# Patient Record
Sex: Male | Born: 1985 | Race: White | Hispanic: No | Smoking: Current every day smoker
Health system: Southern US, Community
[De-identification: ages and names within clinical notes are randomized; demographics above are authoritative.]

## PROBLEM LIST (undated history)

## (undated) DIAGNOSIS — K219 Gastro-esophageal reflux disease without esophagitis: Secondary | ICD-10-CM

## (undated) HISTORY — DX: Gastro-esophageal reflux disease without esophagitis: K21.9

## (undated) HISTORY — PX: WISDOM TOOTH EXTRACTION: SHX21

## (undated) HISTORY — PX: TONSILLECTOMY: SUR1361

---

## 2016-09-19 ENCOUNTER — Ambulatory Visit (INDEPENDENT_AMBULATORY_CARE_PROVIDER_SITE_OTHER): Payer: BLUE CROSS/BLUE SHIELD | Admitting: Primary Care

## 2016-09-19 ENCOUNTER — Encounter: Payer: Self-pay | Admitting: Primary Care

## 2016-09-19 VITALS — BP 120/86 | HR 70 | Temp 98.2°F | Ht 70.25 in | Wt 224.0 lb

## 2016-09-19 DIAGNOSIS — R1030 Lower abdominal pain, unspecified: Secondary | ICD-10-CM | POA: Diagnosis not present

## 2016-09-19 DIAGNOSIS — K219 Gastro-esophageal reflux disease without esophagitis: Secondary | ICD-10-CM | POA: Diagnosis not present

## 2016-09-19 LAB — COMPREHENSIVE METABOLIC PANEL
ALT: 90 U/L — AB (ref 0–53)
AST: 65 U/L — AB (ref 0–37)
Albumin: 4.3 g/dL (ref 3.5–5.2)
Alkaline Phosphatase: 55 U/L (ref 39–117)
BILIRUBIN TOTAL: 0.5 mg/dL (ref 0.2–1.2)
BUN: 6 mg/dL (ref 6–23)
CHLORIDE: 107 meq/L (ref 96–112)
CO2: 23 meq/L (ref 19–32)
CREATININE: 1.03 mg/dL (ref 0.40–1.50)
Calcium: 9.9 mg/dL (ref 8.4–10.5)
GFR: 90.55 mL/min (ref >60.00)
GLUCOSE: 127 mg/dL — AB (ref 70–99)
Potassium: 3.8 mEq/L (ref 3.5–5.1)
SODIUM: 138 meq/L (ref 135–145)
Total Protein: 6.9 g/dL (ref 6.0–8.3)

## 2016-09-19 LAB — CBC WITH DIFFERENTIAL/PLATELET
BASOS ABS: 0.1 10*3/uL (ref 0.0–0.1)
BASOS PCT: 0.9 % (ref 0.0–3.0)
EOS ABS: 0.2 10*3/uL (ref 0.0–0.7)
Eosinophils Relative: 1.8 % (ref 0.0–5.0)
HEMATOCRIT: 51.5 % (ref 39.0–52.0)
Hemoglobin: 17.6 g/dL — ABNORMAL HIGH (ref 13.0–17.0)
LYMPHS ABS: 2.9 10*3/uL (ref 0.7–4.0)
LYMPHS PCT: 33.1 % (ref 12.0–46.0)
MCHC: 34.2 g/dL (ref 30.0–36.0)
MCV: 94.9 fl (ref 78.0–100.0)
Monocytes Absolute: 0.5 10*3/uL (ref 0.1–1.0)
Monocytes Relative: 6 % (ref 3.0–12.0)
NEUTROS ABS: 5.1 10*3/uL (ref 1.4–7.7)
NEUTROS PCT: 58.2 % (ref 43.0–77.0)
PLATELETS: 280 10*3/uL (ref 150.0–400.0)
RBC: 5.43 Mil/uL (ref 4.22–5.81)
RDW: 14.2 % (ref 11.5–15.5)
WBC: 8.7 10*3/uL (ref 4.0–10.5)

## 2016-09-19 MED ORDER — OMEPRAZOLE 40 MG PO CPDR: 40.0000 mg | DELAYED_RELEASE_CAPSULE | Freq: Every day | ORAL | 0 refills | Status: DC

## 2016-09-19 NOTE — Progress Notes (Signed)
Pre visit review using our clinic review tool, if applicable. No additional management support is needed unless otherwise documented below in the visit note. 

## 2016-09-19 NOTE — Progress Notes (Signed)
Subjective:    Patient ID: Dylan Paul, male    DOB: 1986/04/12, 31 y.o.   MRN: [id number]  HPI  Dylan Paul is a 31 year old male who presents today to establish care and discuss the problems mentioned below. Will obtain old records.  1) Abdominal Pain: Located to the mid lower abdomen that has been present for the past 2-3 weeks. He describes his pain as a dull throbbing sensation that is intermittent. He denies fevers. He does experience diarrhea every 2-3 days and will get 4 episodes of diarrhea each day. He denies unexplained weight loss, bloody stools.   2) GERD: Long history of heartburn since childhood. His symptoms consist of esophageal burning, chest pressure, waking up with nausea and then vomiting. The vomiting is the newest symptom that has occurred three times over the past 2 months. He takes Zantac 150 mg once nightly 4-5 times weekly when he remembers and also takes about 10 Tums daily throughout the day. He denies bloody vomitus.  Diet currently consists of:  Breakfast: Skips Lunch: Sandwich, fast food Dinner: Grilled chicken, pasta, potatoes Snacks: Occasionally chips Desserts: Occasionally Beverages: Mostly soda and sweet tea, sometimes water. No coffee. Drinks 3-12 beers daily, six days weekly.    Review of Systems  Constitutional: Negative for fatigue and fever.  Respiratory: Negative for shortness of breath.   Cardiovascular: Negative for chest pain.  Gastrointestinal: Positive for abdominal pain, diarrhea, nausea and vomiting. Negative for blood in stool and constipation.  Neurological: Negative for weakness.     Past Medical History:  Diagnosis Date  . GERD (gastroesophageal reflux disease)      Social History   Social History  . Marital status: Married    Spouse name: N/A  . Number of children: N/A  . Years of education: N/A   Occupational History  . Not on file.   Social History Main Topics  . Smoking status: Current Every Day  Smoker    Packs/day: 1.00    Types: Cigarettes  . Smokeless tobacco: Never Used  . Alcohol use Yes  . Drug use: No  . Sexual activity: Not on file   Other Topics Concern  . Not on file   Social History Narrative   Married.   Wife pregnant.   Works at [employer].   Enjoys playing video games.     No past surgical history on file.  Family History  Problem Relation Age of Onset  . Hypertension Mother   . Lupus Father   . Heart disease Maternal Grandmother   . Skin cancer Paternal Grandmother     No Known Allergies  No current outpatient prescriptions on file prior to visit.   No current facility-administered medications on file prior to visit.     BP 120/86   Pulse 70   Temp 98.2 F (36.8 C) (Oral)   Ht 5' 10.25" (1.784 m)   Wt 224 lb (101.6 kg)   SpO2 97%   BMI 31.91 kg/m    Objective:   Physical Exam  Constitutional: He is oriented to person, place, and time. He appears well-nourished. He does not appear ill.  Neck: Neck supple.  Cardiovascular: Normal rate and regular rhythm.   Pulmonary/Chest: Effort normal and breath sounds normal. He has no wheezes. He has no rales.  Abdominal: Soft. Bowel sounds are normal. There is tenderness in the left lower quadrant.  Neurological: He is alert and oriented to person, place, and time.  Skin: Skin is  warm and dry.  Psychiatric: He has a normal mood and affect.          Assessment & Plan:

## 2016-09-19 NOTE — Patient Instructions (Signed)
Complete lab work prior to leaving today. I will notify you of your results once received.   Start omeprazole 40 mg tablets for acid reflux. Take 1 tablet by mouth once daily for 30 days. We will then reduce your dose thereafter.  I will call you in 1 week for an update in your acid reflux.   Stop smoking, stop eating fast/fried foods, reduce alcohol as all of these things can trigger reflux.. Take a look at the information below.  It was a pleasure to meet you today! Please don't hesitate to call me with any questions. Welcome to [facility]!    Food Choices for Gastroesophageal Reflux Disease, Adult When you have gastroesophageal reflux disease (GERD), the foods you eat and your eating habits are very important. Choosing the right foods can help ease your discomfort. What guidelines do I need to follow?  Choose fruits, vegetables, whole grains, and low-fat dairy products.  Choose low-fat meat, fish, and poultry.  Limit fats such as oils, salad dressings, butter, nuts, and avocado.  Keep a food diary. This helps you identify foods that cause symptoms.  Avoid foods that cause symptoms. These may be different for everyone.  Eat small meals often instead of 3 large meals a day.  Eat your meals slowly, in a place where you are relaxed.  Limit fried foods.  Cook foods using methods other than frying.  Avoid drinking alcohol.  Avoid drinking large amounts of liquids with your meals.  Avoid bending over or lying down until 2-3 hours after eating. What foods are not recommended? These are some foods and drinks that may make your symptoms worse: Vegetables  Tomatoes. Tomato juice. Tomato and spaghetti sauce. Chili peppers. Onion and garlic. Horseradish. Fruits  Oranges, grapefruit, and lemon (fruit and juice). Meats  High-fat meats, fish, and poultry. This includes hot dogs, ribs, ham, sausage, salami, and bacon. Dairy  Whole milk and chocolate milk. Sour cream. Cream. Butter.  Ice cream. Cream cheese. Drinks  Coffee and tea. Bubbly (carbonated) drinks or energy drinks. Condiments  Hot sauce. Barbecue sauce. Sweets/Desserts  Chocolate and cocoa. Donuts. Peppermint and spearmint. Fats and Oils  High-fat foods. This includes [place] fries and potato chips. Other  Vinegar. Strong spices. This includes black pepper, white pepper, red pepper, cayenne, curry powder, cloves, ginger, and chili powder. The items listed above may not be a complete list of foods and drinks to avoid. Contact your dietitian for more information.  This information is not intended to replace advice given to you by your health care provider. Make sure you discuss any questions you have with your health care provider. Document Released: 11/13/2011 Document Revised: 10/20/2015 Document Reviewed: 03/18/2013 Elsevier Interactive Patient Education  2017 [employer].

## 2016-09-19 NOTE — Assessment & Plan Note (Signed)
Long history of, not well controlled based off of symptoms and Tums consumption. Given intensity of symptoms will treat with omeprazole 40 mg once daily for 30 days. Will check on his symptoms in 1 week. Long discussion today regarding trigger foods as his diet is full of triggers of GERD. If no improvement after 1-2 weeks, add in HS Zantac. Consider GI referral in 1 month if symptoms persist.

## 2016-09-19 NOTE — Assessment & Plan Note (Signed)
Symptoms sound like IBS, especially given diarrhea which is chronic for him. Exam today with mild tenderness to LLQ. Check CBC and CMP today. Continue to monitor.

## 2016-09-26 ENCOUNTER — Telehealth: Payer: Self-pay | Admitting: Primary Care

## 2016-09-26 NOTE — Telephone Encounter (Signed)
-----   Message from Doreene Nest, NP sent at 09/19/2016  2:53 PM EDT ----- Regarding: GERD How's he doing on the omeprazole 40 mg for GERD?

## 2016-09-27 NOTE — Telephone Encounter (Signed)
Per DPR, left detail message of Kate's comments for patient to call back. 

## 2016-09-27 NOTE — Telephone Encounter (Signed)
Noted and glad to hear! 

## 2016-09-27 NOTE — Telephone Encounter (Signed)
Patient returned Chan's call. Patient said the Omeprazole is doing great.  Patient said he's had almost no heartburn.

## 2016-10-18 ENCOUNTER — Telehealth: Payer: Self-pay | Admitting: Primary Care

## 2016-10-18 DIAGNOSIS — K219 Gastro-esophageal reflux disease without esophagitis: Secondary | ICD-10-CM

## 2016-10-18 MED ORDER — OMEPRAZOLE 20 MG PO CPDR: 20.0000 mg | DELAYED_RELEASE_CAPSULE | Freq: Every day | ORAL | 0 refills | Status: DC

## 2016-10-18 NOTE — Telephone Encounter (Signed)
Ok to refill? Electronically refill request for omeprazole (PRILOSEC) 40 MG capsule. Last prescribed and seen on 09/19/16.

## 2016-10-18 NOTE — Telephone Encounter (Signed)
Please notify patient that I've sent in a Rx for omeprazole 20 mg which is a smaller dose. I want to see if the lower dose is effective. Please have him update [name] if symptoms return. We will re-evaluate in 1 month.

## 2016-10-19 NOTE — Telephone Encounter (Signed)
Message left for patient to return my call.  

## 2016-10-25 NOTE — Telephone Encounter (Signed)
Message left for patient to return my call.  

## 2016-11-23 MED ORDER — OMEPRAZOLE 20 MG PO CPDR: 20.0000 mg | DELAYED_RELEASE_CAPSULE | Freq: Every day | ORAL | 1 refills | Status: DC

## 2016-11-23 NOTE — Telephone Encounter (Signed)
Patient's wife,Laura,returned Chan's call.  She can be reached at [phone].

## 2016-11-23 NOTE — Telephone Encounter (Signed)
Spoken to patient's wife and schedule follow up for patient on 12/17/2016.   Refill has been send for patient.

## 2016-11-23 NOTE — Addendum Note (Signed)
Addended by: Tawnya Crook on: 11/23/2016 02:35 PM   Modules accepted: Orders

## 2016-12-17 ENCOUNTER — Encounter: Payer: Self-pay | Admitting: Primary Care

## 2016-12-17 ENCOUNTER — Ambulatory Visit (INDEPENDENT_AMBULATORY_CARE_PROVIDER_SITE_OTHER): Payer: BLUE CROSS/BLUE SHIELD | Admitting: Primary Care

## 2016-12-17 VITALS — BP 132/88 | HR 79 | Temp 98.4°F | Ht 70.25 in | Wt 224.0 lb

## 2016-12-17 DIAGNOSIS — R945 Abnormal results of liver function studies: Secondary | ICD-10-CM

## 2016-12-17 DIAGNOSIS — K219 Gastro-esophageal reflux disease without esophagitis: Secondary | ICD-10-CM | POA: Diagnosis not present

## 2016-12-17 DIAGNOSIS — R7989 Other specified abnormal findings of blood chemistry: Secondary | ICD-10-CM | POA: Diagnosis not present

## 2016-12-17 DIAGNOSIS — R739 Hyperglycemia, unspecified: Secondary | ICD-10-CM | POA: Diagnosis not present

## 2016-12-17 LAB — HEPATIC FUNCTION PANEL
ALT: 56 U/L — ABNORMAL HIGH (ref 0–53)
AST: 28 U/L (ref 0–37)
Albumin: 4.3 g/dL (ref 3.5–5.2)
Alkaline Phosphatase: 60 U/L (ref 39–117)
BILIRUBIN TOTAL: 0.5 mg/dL (ref 0.2–1.2)
Bilirubin, Direct: 0.1 mg/dL (ref 0.0–0.3)
Total Protein: 6.8 g/dL (ref 6.0–8.3)

## 2016-12-17 LAB — HEMOGLOBIN A1C: HEMOGLOBIN A1C: 5.6 % (ref 4.6–6.5)

## 2016-12-17 MED ORDER — OMEPRAZOLE 40 MG PO CPDR: 40.0000 mg | DELAYED_RELEASE_CAPSULE | Freq: Every day | ORAL | 0 refills | Status: DC

## 2016-12-17 NOTE — Assessment & Plan Note (Signed)
Symptoms resolved on omeprazole 40 mg, returned with dose reduction down to 20 mg. Also suspect diet and smoking to be contributing. Will refill the 40 mg dose for a three month supply, then reduce dose to 20 mg.  Handout provided regarding GERD triggers.

## 2016-12-17 NOTE — Patient Instructions (Signed)
Restart omeprazole 40 mg tablets for acid reflux. Let's try this again for three months, then reduce down to 20 mg thereafter. Please call me when you need a refill.  You must work on improving your diet as discussed by avoiding trigger foods.  Complete lab work prior to leaving today. I will notify you of your results once received.   It was a pleasure to see you today!   Food Choices for Gastroesophageal Reflux Disease, Adult When you have gastroesophageal reflux disease (GERD), the foods you eat and your eating habits are very important. Choosing the right foods can help ease your discomfort. What guidelines do I need to follow?  Choose fruits, vegetables, whole grains, and low-fat dairy products.  Choose low-fat meat, fish, and poultry.  Limit fats such as oils, salad dressings, butter, nuts, and avocado.  Keep a food diary. This helps you identify foods that cause symptoms.  Avoid foods that cause symptoms. These may be different for everyone.  Eat small meals often instead of 3 large meals a day.  Eat your meals slowly, in a place where you are relaxed.  Limit fried foods.  Cook foods using methods other than frying.  Avoid drinking alcohol.  Avoid drinking large amounts of liquids with your meals.  Avoid bending over or lying down until 2-3 hours after eating. What foods are not recommended? These are some foods and drinks that may make your symptoms worse: Vegetables Tomatoes. Tomato juice. Tomato and spaghetti sauce. Chili peppers. Onion and garlic. Horseradish. Fruits Oranges, grapefruit, and lemon (fruit and juice). Meats High-fat meats, fish, and poultry. This includes hot dogs, ribs, ham, sausage, salami, and bacon. Dairy Whole milk and chocolate milk. Sour cream. Cream. Butter. Ice cream. Cream cheese. Drinks Coffee and tea. Bubbly (carbonated) drinks or energy drinks. Condiments Hot sauce. Barbecue sauce. Sweets/Desserts Chocolate and cocoa. Donuts.  Peppermint and spearmint. Fats and Oils High-fat foods. This includes [place] fries and potato chips. Other Vinegar. Strong spices. This includes black pepper, white pepper, red pepper, cayenne, curry powder, cloves, ginger, and chili powder. The items listed above may not be a complete list of foods and drinks to avoid. Contact your dietitian for more information. This information is not intended to replace advice given to you by your health care provider. Make sure you discuss any questions you have with your health care provider. Document Released: 11/13/2011 Document Revised: 10/20/2015 Document Reviewed: 03/18/2013 Elsevier Interactive Patient Education  2017 [employer].

## 2016-12-17 NOTE — Progress Notes (Signed)
   Subjective:    Patient ID: Dylan Paul, male    DOB: 1985/11/05, 31 y.o.   MRN: [id number]  HPI  Dylan Paul is a 31 year old male who presents today for follow up of GERD. He presented as a new patient in late April 2018 with complaints of GERD symptoms. He was treated with omeprazole 40 mg with resolve in symptoms. Since he had improvement on the 40 mg dose, his does was decreased to 20 mg after 4 weeks.   Since his dose reduction he's noticed a return in his symptoms. He's experiencing daily esophageal burning and has had two episodes of vomiting. He's slowly improving his diet but is having a difficult time. He's still smoking 1 PPD and is drinking 1 case of beer weekly.   Review of Systems  Constitutional: Negative for fever.  Respiratory: Negative for shortness of breath.   Cardiovascular: Negative for chest pain.  Gastrointestinal: Positive for vomiting. Negative for abdominal pain.       GERD       Past Medical History:  Diagnosis Date  . GERD (gastroesophageal reflux disease)      Social History   Social History  . Marital status: Married    Spouse name: N/A  . Number of children: N/A  . Years of education: N/A   Occupational History  . Not on file.   Social History Main Topics  . Smoking status: Current Every Day Smoker    Packs/day: 1.00    Types: Cigarettes  . Smokeless tobacco: Never Used  . Alcohol use Yes  . Drug use: No  . Sexual activity: Not on file   Other Topics Concern  . Not on file   Social History Narrative   Married.   Wife pregnant.   Works at [employer].   Enjoys playing video games.     No past surgical history on file.  Family History  Problem Relation Age of Onset  . Hypertension Mother   . Lupus Father   . Heart disease Maternal Grandmother   . Skin cancer Paternal Grandmother     No Known Allergies  No current outpatient prescriptions on file prior to visit.   No current  facility-administered medications on file prior to visit.     BP 132/88   Pulse 79   Temp 98.4 F (36.9 C) (Oral)   Ht 5' 10.25" (1.784 m)   Wt 224 lb (101.6 kg)   SpO2 98%   BMI 31.91 kg/m    Objective:   Physical Exam  Constitutional: He appears well-nourished.  Neck: Neck supple.  Cardiovascular: Normal rate and regular rhythm.   Pulmonary/Chest: Effort normal and breath sounds normal.  Abdominal: Soft. Bowel sounds are normal.  Skin: Skin is warm and dry.          Assessment & Plan:

## 2016-12-18 ENCOUNTER — Encounter: Payer: Self-pay | Admitting: *Deleted

## 2017-01-15 ENCOUNTER — Telehealth: Payer: Self-pay

## 2017-01-15 NOTE — Telephone Encounter (Signed)
Pt's wife called to make appt for TDap vaccine for the pt. He said he was supposed to have gotten it at his last OV but did not. Please let us know if that is okay and then have someone schedule the appt on the nurse schedule.

## 2017-01-15 NOTE — Telephone Encounter (Signed)
A nurse visit appt has been scheduled for patient on 01/17/2017

## 2017-01-15 NOTE — Telephone Encounter (Signed)
Okay to administer if due. Check [facility].

## 2017-01-17 ENCOUNTER — Ambulatory Visit (INDEPENDENT_AMBULATORY_CARE_PROVIDER_SITE_OTHER): Payer: BLUE CROSS/BLUE SHIELD

## 2017-01-17 DIAGNOSIS — Z23 Encounter for immunization: Secondary | ICD-10-CM | POA: Diagnosis not present

## 2017-03-20 ENCOUNTER — Other Ambulatory Visit: Payer: Self-pay | Admitting: Primary Care

## 2017-03-20 DIAGNOSIS — K219 Gastro-esophageal reflux disease without esophagitis: Secondary | ICD-10-CM

## 2019-07-09 DIAGNOSIS — H109 Unspecified conjunctivitis: Secondary | ICD-10-CM | POA: Diagnosis not present

## 2019-07-09 DIAGNOSIS — L03213 Periorbital cellulitis: Secondary | ICD-10-CM | POA: Diagnosis not present

## 2020-01-13 DIAGNOSIS — M79604 Pain in right leg: Secondary | ICD-10-CM | POA: Diagnosis not present

## 2020-01-13 DIAGNOSIS — M79605 Pain in left leg: Secondary | ICD-10-CM | POA: Diagnosis not present

## 2020-01-19 ENCOUNTER — Ambulatory Visit: Payer: Self-pay

## 2020-01-19 ENCOUNTER — Ambulatory Visit
Admission: RE | Admit: 2020-01-19 | Discharge: 2020-01-19 | Disposition: A | Discharge status: Home / Self Care | Payer: BC Managed Care – PPO | Attending: Urgent Care | Admitting: Urgent Care

## 2020-01-19 ENCOUNTER — Ambulatory Visit
Admission: RE | Admit: 2020-01-19 | Discharge: 2020-01-19 | Disposition: A | Discharge status: Home / Self Care | Payer: BC Managed Care – PPO | Source: Ambulatory Visit | Attending: Urgent Care | Admitting: Urgent Care

## 2020-01-19 ENCOUNTER — Other Ambulatory Visit: Payer: Self-pay

## 2020-01-19 ENCOUNTER — Ambulatory Visit
Admission: EM | Admit: 2020-01-19 | Discharge: 2020-01-19 | Disposition: A | Discharge status: Home / Self Care | Payer: BC Managed Care – PPO

## 2020-01-19 DIAGNOSIS — M79606 Pain in leg, unspecified: Secondary | ICD-10-CM | POA: Insufficient documentation

## 2020-01-19 DIAGNOSIS — M6281 Muscle weakness (generalized): Secondary | ICD-10-CM | POA: Insufficient documentation

## 2020-01-19 DIAGNOSIS — M5136 Other intervertebral disc degeneration, lumbar region: Secondary | ICD-10-CM | POA: Diagnosis not present

## 2020-01-19 DIAGNOSIS — F17200 Nicotine dependence, unspecified, uncomplicated: Secondary | ICD-10-CM

## 2020-01-19 DIAGNOSIS — I739 Peripheral vascular disease, unspecified: Secondary | ICD-10-CM

## 2020-01-19 DIAGNOSIS — R531 Weakness: Secondary | ICD-10-CM | POA: Diagnosis not present

## 2020-01-19 DIAGNOSIS — M79661 Pain in right lower leg: Secondary | ICD-10-CM

## 2020-01-19 DIAGNOSIS — R2243 Localized swelling, mass and lump, lower limb, bilateral: Secondary | ICD-10-CM

## 2020-01-19 DIAGNOSIS — M79662 Pain in left lower leg: Secondary | ICD-10-CM

## 2020-01-19 MED ORDER — PREDNISONE 20 MG PO TABS: ORAL_TABLET | ORAL | 0 refills | Status: DC

## 2020-01-19 NOTE — ED Provider Notes (Signed)
Dylan Paul   MRN: [id number] DOB: 1986/01/30  Subjective:   Dylan Paul is a 34 y.o. male presenting for 2-week history of persistent bilateral lower leg/calf pain.  Has had some swelling in his ankles as well.  States that at times he gets burning and tingling sensations.  Symptoms are worse when he is walking and standing for long periods, improves with rest.  He has been seen for this already and was prescribed naproxen which has not helped.  Denies falls, trauma, incontinence, hematuria.  Denies history of back injury.  Patient states that he was helping someone to move before the pain started.  Had to do a lot of heavy lifting.  Denies any back pain, radicular symptoms.  Patient is a smoker has about a 10-pack-year history.  He does state that once over the past weekend he felt a little weak on his right leg and slept while going down the steps, states that he fell from this.  But denies any specific or blunt trauma.  No current facility-administered medications for this encounter.  Current Outpatient Medications:    naproxen (NAPROSYN) 250 MG tablet, Take 500 mg by mouth 2 (two) times daily with a meal., Disp: , Rfl:    Not on File  Past Medical History:  Diagnosis Date   GERD (gastroesophageal reflux disease)      Past Surgical History:  Procedure Laterality Date   TONSILLECTOMY      Family History  Problem Relation Age of Onset   Hypertension Mother    Lupus Father     Social History   Tobacco Use   Smoking status: Current Every Day Smoker    Packs/day: 1.00    Years: 10.00    Pack years: 10.00    Types: Cigarettes   Smokeless tobacco: Never Used  Building services engineer Use: Never used  Substance Use Topics   Alcohol use: Yes    Comment: has a 6-pack of beer 4-5 times per week   Drug use: Yes    Frequency: 7.0 times per week    Types: Marijuana    ROS   Objective:   Vitals: BP 123/83    Pulse (!) 108    Temp 98.5 F (36.9 C)  (Oral)    Resp 18    SpO2 97%   Physical Exam Constitutional:      General: He is not in acute distress.    Appearance: Normal appearance. He is well-developed and normal weight. He is not ill-appearing, toxic-appearing or diaphoretic.  HENT:     Head: Normocephalic and atraumatic.     Right Ear: External ear normal.     Left Ear: External ear normal.     Nose: Nose normal.     Mouth/Throat:     Pharynx: Oropharynx is clear.  Eyes:     General: No scleral icterus.       Right eye: No discharge.        Left eye: No discharge.     Extraocular Movements: Extraocular movements intact.     Pupils: Pupils are equal, round, and reactive to light.  Cardiovascular:     Rate and Rhythm: Normal rate.     Comments: 2+ dorsalis pedis, posterior tibial pulses bilaterally. Pulmonary:     Effort: Pulmonary effort is normal.  Musculoskeletal:        General: Swelling (Localized swelling of both ankles without tenderness) present.     Cervical back: Normal range of motion.  Lumbar back: No swelling, edema, deformity, signs of trauma, lacerations, spasms, tenderness or bony tenderness. Normal range of motion. Negative right straight leg raise test and negative left straight leg raise test. No scoliosis.     Right lower leg: Tenderness present. No swelling, deformity, lacerations or bony tenderness. No edema.     Left lower leg: Tenderness present. No swelling, deformity, lacerations or bony tenderness. No edema.     Comments: Strength 5/5 for lower extremities.  Skin:    General: Skin is warm and dry.     Findings: No bruising, erythema or rash.  Neurological:     Mental Status: He is alert and oriented to person, place, and time.     Motor: No weakness.     Coordination: Coordination normal.     Gait: Gait normal.     Deep Tendon Reflexes: Reflexes normal.  Psychiatric:        Mood and Affect: Mood normal.        Behavior: Behavior normal.        Thought Content: Thought content normal.         Judgment: Judgment normal.       Assessment and Plan :   PDMP not reviewed this encounter.  1. Bilateral calf pain   2. Smoker   3. Localized swelling of both lower legs   4. Claudication [facility])     Discussed possible neurogenic claudication, lumbar x-ray pending.  Also discussed possibility of claudication related to peripheral vascular disease.  Discussed potential management of both and patient would like to use an oral steroid course to address the former.  Emphasized that an MRI would be necessary if he has no improvement.  He can follow-up with his primary care provider soon as possible for this.  I did refer him to vascular surgery clinic in [facility] for further work-up of claudication related to smoking. Counseled patient on potential for adverse effects with medications prescribed/recommended today, ER and return-to-clinic precautions discussed, patient verbalized understanding.    Wallis Bamberg, [place] 01/19/20 1538

## 2020-01-19 NOTE — Discharge Instructions (Signed)
Please contact the vascular surgery clinic in [facility] for work-up related to your lower leg pains regarding your smoking.  Today, we will try to obtain an x-ray to rule out a problem within the back as a source of your symptoms.  Head [place] [address] toward [address]  Then turned left at the [address] on to [address]  Continue on to [address] [facility]  Turn left onto [address].  Turn right onto [address].  The address is [address] in [place], [place]IP Code [address].

## 2020-01-19 NOTE — ED Triage Notes (Signed)
Pt presents with bilateral calf pain radiating to foot x 2 weeks.  Says prior to that the skin felt numb, like pins and needles.  Sensation occurs in bilateral legs equally.  Was seen last week and given an anti-inflammatory.  Med is not helping and pain is worsening since then.  Feels like he is losing stability, lost balance last weekend.    Pt not aware of any injury to spine but did help someone move, with heavy lifting, just prior to pain starting. Denies back pain. Reports swelling in ankles and feet usually at end of day.

## 2020-01-20 ENCOUNTER — Encounter: Payer: Self-pay | Admitting: Primary Care

## 2020-01-22 ENCOUNTER — Ambulatory Visit (INDEPENDENT_AMBULATORY_CARE_PROVIDER_SITE_OTHER): Payer: BC Managed Care – PPO | Admitting: Primary Care

## 2020-01-22 ENCOUNTER — Other Ambulatory Visit: Payer: Self-pay

## 2020-01-22 ENCOUNTER — Encounter: Payer: Self-pay | Admitting: Primary Care

## 2020-01-22 VITALS — BP 138/82 | HR 78 | Ht 70.25 in | Wt 192.0 lb

## 2020-01-22 DIAGNOSIS — Z114 Encounter for screening for human immunodeficiency virus [HIV]: Secondary | ICD-10-CM | POA: Diagnosis not present

## 2020-01-22 DIAGNOSIS — R29898 Other symptoms and signs involving the musculoskeletal system: Secondary | ICD-10-CM | POA: Insufficient documentation

## 2020-01-22 DIAGNOSIS — M79605 Pain in left leg: Secondary | ICD-10-CM

## 2020-01-22 DIAGNOSIS — M79604 Pain in right leg: Secondary | ICD-10-CM | POA: Diagnosis not present

## 2020-01-22 DIAGNOSIS — R630 Anorexia: Secondary | ICD-10-CM | POA: Diagnosis not present

## 2020-01-22 DIAGNOSIS — Z1159 Encounter for screening for other viral diseases: Secondary | ICD-10-CM

## 2020-01-22 NOTE — Patient Instructions (Signed)
Stop by the lab prior to leaving today. I will notify you of your results once received.   You will be contacted regarding your referral to Neurology and for the ultrasound.  Please let us know if you have not been contacted within two weeks.   It was a pleasure to see you today!

## 2020-01-22 NOTE — Assessment & Plan Note (Signed)
Interesting case, based off of symptoms and HPI it seems as though he may have claudication.  Musculoskeletal involvement is clearly on the differential list, but he denies backslash hip pain.  There is evidence of gait imbalance and weakness on exam.  Would need to rule out neurological cause.  Referral placed to neurology. Order placed for ABIs. Checking labs today including TSH, CMP, CBC, B12, HIV, hep C.  Consider imaging of the brain if needed.  Await results. 

## 2020-01-22 NOTE — Progress Notes (Signed)
Subjective:    Patient ID: Dylan Paul, male    DOB: 08/26/1985, 35 y.o.   MRN: [id number]  HPI  This visit occurred during the SARS-CoV-2 public health emergency.  Safety protocols were in place, including screening questions prior to the visit, additional usage of staff PPE, and extensive cleaning of exam room while observing appropriate contact time as indicated for disinfecting solutions.   Dylan Paul is a 34 year old male who presents today to establish care and discuss the problems mentioned below. Will obtain/review records.  1) Lower Extremity Pain/Calf Pain: Acute for the last 4 to 6 weeks.  Evaluated at [facility] on 01/19/20 with reports of a 2 week history of bilateral calf pain with ankle edema. He described his pain as burning and tingling sensations, worse with walking, improved with rest. During his stay at Urgent Care he underwent xray of the lumbar spine which showed mild disc space narrowing at L4-5, otherwise negative. He was referred to vascular services.   He actually presented to [facility] on 01/13/20 for same symptoms, was told that it was muscular.  He was provided with naproxen which did not improve symptoms.  Today he endorses that symptoms began about 4-6 weeks ago with constant numbness and tingling to his bilateral lower extremities that begins at the hips with radiation down to his toes. Now he's having pain behind his ankles and calves, also with edema to feet and calves, and lower extremity weakness.  Pain is worse with ambulation, improved with rest.  He is also noticed floaters in his eyes.  Has not seen an eye doctor.  He's also fallen twice as his "right leg gave out" both times. After the second time he fell he had a hard time getting up. He is a smoker of cigarettes and has a 10 pack year history. He drinks one six pack of beer five times weekly on average.   He denies family history of neurological problems. He denies back  pain, hip pain, headaches, dizziness.  His mother is concerned about gradual weight loss over the last several years. He admits to a decrease in appetite.   BP Readings from Last 3 Encounters:  01/22/20 138/82  01/19/20 123/83  12/17/16 132/88   Wt Readings from Last 3 Encounters:  01/22/20 192 lb (87.1 kg)  12/17/16 224 lb (101.6 kg)  09/19/16 224 lb (101.6 kg)     Review of Systems  Constitutional: Positive for appetite change.  Eyes: Positive for visual disturbance.  Gastrointestinal: Negative for abdominal pain.  Musculoskeletal: Positive for myalgias. Negative for back pain.  Skin: Negative for color change.  Neurological: Positive for weakness and numbness. Negative for dizziness and headaches.  Psychiatric/Behavioral: The patient is not nervous/anxious.        Past Medical History:  Diagnosis Date   GERD (gastroesophageal reflux disease)      Social History   Socioeconomic History   Marital status: Married    Spouse name: Not on file   Number of children: Not on file   Years of education: Not on file   Highest education level: Not on file  Occupational History   Not on file  Tobacco Use   Smoking status: Current Every Day Smoker    Packs/day: 1.00    Years: 10.00    Pack years: 10.00    Types: Cigarettes   Smokeless tobacco: Never Used  Vaping Use   Vaping Use: Never used  Substance and Sexual Activity  Alcohol use: Yes    Comment: has a 6-pack of beer 4-5 times per week   Drug use: Yes    Frequency: 7.0 times per week    Types: Marijuana   Sexual activity: Yes  Other Topics Concern   Not on file  Social History Narrative   ** Merged History Encounter **       Married. Wife pregnant. Works at [employer]. Enjoys playing video games.    Social Determinants of Health   Financial Resource Strain:    Difficulty of Paying Living Expenses: Not on file  Food Insecurity:    Worried About Programme researcher, broadcasting/film/video in  the Last Year: Not on file   [employer] of Food in the Last Year: Not on file  Transportation Needs:    Lack of Transportation (Medical): Not on file   Lack of Transportation (Non-Medical): Not on file  Physical Activity:    Days of Exercise per Week: Not on file   Minutes of Exercise per Session: Not on file  Stress:    Feeling of Stress : Not on file  Social Connections:    Frequency of Communication with Friends and Family: Not on file   Frequency of Social Gatherings with Friends and Family: Not on file   Attends Religious Services: Not on file   Active Member of Clubs or Organizations: Not on file   Attends Banker Meetings: Not on file   Marital Status: Not on file  Intimate Partner Violence:    Fear of Current or Ex-Partner: Not on file   Emotionally Abused: Not on file   Physically Abused: Not on file   Sexually Abused: Not on file    Past Surgical History:  Procedure Laterality Date   TONSILLECTOMY     WISDOM TOOTH EXTRACTION      Family History  Problem Relation Age of Onset   Hypertension Mother    Lupus Father        Skin   Heart disease Maternal Grandmother    Skin cancer Paternal Grandmother     No Known Allergies  Current Outpatient Medications on File Prior to Visit  Medication Sig Dispense Refill   naproxen (NAPROSYN) 250 MG tablet Take 500 mg by mouth 2 (two) times daily with a meal.     omeprazole (PRILOSEC) 40 MG capsule TAKE 1 CAPSULE BY MOUTH EVERY DAY 90 capsule 0   predniSONE (DELTASONE) 20 MG tablet Take 2 tablets daily with breakfast. 10 tablet 0   No current facility-administered medications on file prior to visit.    BP 138/82    Pulse 78    Ht 5' 10.25" (1.784 m)    Wt 192 lb (87.1 kg)    SpO2 98%    BMI 27.35 kg/m    Objective:   Physical Exam Eyes:     Extraocular Movements: Extraocular movements intact.  Cardiovascular:     Rate and Rhythm: Normal rate and regular rhythm.  Pulmonary:      Effort: Pulmonary effort is normal.     Breath sounds: Normal breath sounds.  Musculoskeletal:     Cervical back: Neck supple.     Comments: Strength equal bilaterally to lower extremities.  Ambulates with unsteady gait.  Cannot stand on 1 leg without imbalance.  Skin:    General: Skin is warm and dry.  Neurological:     Mental Status: He is alert and oriented to person, place, and time.     Cranial Nerves:  No cranial nerve deficit.     Sensory: Sensation is intact.     Gait: Gait abnormal.     Deep Tendon Reflexes:     Reflex Scores:      Patellar reflexes are 2+ on the right side and 2+ on the left side.    Comments: Gait appears imbalanced with ambulation.  Unable to stand on one leg without imbalance.  Psychiatric:        Mood and Affect: Mood normal.            Assessment & Plan:

## 2020-01-22 NOTE — Assessment & Plan Note (Signed)
Interesting case, based off of symptoms and HPI it seems as though he may have claudication.  Musculoskeletal involvement is clearly on the differential list, but he denies backslash hip pain.  There is evidence of gait imbalance and weakness on exam.  Would need to rule out neurological cause.  Referral placed to neurology. Order placed for ABIs. Checking labs today including TSH, CMP, CBC, B12, HIV, hep C.  Consider imaging of the brain if needed.  Await results.

## 2020-01-22 NOTE — Assessment & Plan Note (Signed)
Gradual over the last several years. Weight loss of about 30 pounds since 2018 noted in chart.  Checking labs today, including HIV and hep C screen. Await results.

## 2020-01-25 ENCOUNTER — Other Ambulatory Visit: Payer: Self-pay | Admitting: Primary Care

## 2020-01-25 DIAGNOSIS — R7989 Other specified abnormal findings of blood chemistry: Secondary | ICD-10-CM

## 2020-01-25 DIAGNOSIS — R29898 Other symptoms and signs involving the musculoskeletal system: Secondary | ICD-10-CM

## 2020-01-25 LAB — COMPREHENSIVE METABOLIC PANEL
AG Ratio: 1.6 (calc) (ref 1.0–2.5)
ALT: 19 U/L (ref 9–46)
AST: 21 U/L (ref 10–40)
Albumin: 3.8 g/dL (ref 3.6–5.1)
Alkaline phosphatase (APISO): 58 U/L (ref 36–130)
BUN: 10 mg/dL (ref 7–25)
CO2: 23 mmol/L (ref 20–32)
Calcium: 9.6 mg/dL (ref 8.6–10.3)
Chloride: 105 mmol/L (ref 98–110)
Creat: 0.98 mg/dL (ref 0.60–1.35)
Globulin: 2.4 g/dL (calc) (ref 1.9–3.7)
Glucose, Bld: 131 mg/dL — ABNORMAL HIGH (ref 65–99)
Potassium: 4 mmol/L (ref 3.5–5.3)
Sodium: 140 mmol/L (ref 135–146)
Total Bilirubin: 0.7 mg/dL (ref 0.2–1.2)
Total Protein: 6.2 g/dL (ref 6.1–8.1)

## 2020-01-25 LAB — HIV ANTIBODY (ROUTINE TESTING W REFLEX): HIV 1&2 Ab, 4th Generation: NONREACTIVE

## 2020-01-25 LAB — CBC
HCT: 52.9 % — ABNORMAL HIGH (ref 38.5–50.0)
Hemoglobin: 18.5 g/dL — ABNORMAL HIGH (ref 13.2–17.1)
MCH: 35.5 pg — ABNORMAL HIGH (ref 27.0–33.0)
MCHC: 35 g/dL (ref 32.0–36.0)
MCV: 101.5 fL — ABNORMAL HIGH (ref 80.0–100.0)
MPV: 10.5 fL (ref 7.5–12.5)
Platelets: 286 10*3/uL (ref 140–400)
RBC: 5.21 10*6/uL (ref 4.20–5.80)
RDW: 13.6 % (ref 11.0–15.0)
WBC: 15.8 10*3/uL — ABNORMAL HIGH (ref 3.8–10.8)

## 2020-01-25 LAB — HEPATITIS C ANTIBODY
Hepatitis C Ab: NONREACTIVE
SIGNAL TO CUT-OFF: 0.07 (ref <1.00)

## 2020-01-25 LAB — TSH: TSH: 2.98 mIU/L (ref 0.40–4.50)

## 2020-01-25 LAB — LIPID PANEL
Cholesterol: 159 mg/dL (ref <200)
HDL: 30 mg/dL — ABNORMAL LOW (ref >=40)
LDL Cholesterol (Calc): 83 mg/dL (calc)
Non-HDL Cholesterol (Calc): 129 mg/dL (calc) (ref <130)
Total CHOL/HDL Ratio: 5.3 (calc) — ABNORMAL HIGH (ref <5.0)
Triglycerides: 382 mg/dL — ABNORMAL HIGH (ref <150)

## 2020-01-25 LAB — HEMOGLOBIN A1C
Hgb A1c MFr Bld: 5.1 % of total Hgb (ref <5.7)
Mean Plasma Glucose: 100 (calc)
eAG (mmol/L): 5.5 (calc)

## 2020-01-25 LAB — VITAMIN B12: Vitamin B-12: 380 pg/mL (ref 200–1100)

## 2020-01-27 ENCOUNTER — Ambulatory Visit (HOSPITAL_COMMUNITY)
Admission: RE | Admit: 2020-01-27 | Discharge: 2020-01-27 | Disposition: A | Discharge status: Home / Self Care | Payer: BC Managed Care – PPO | Source: Ambulatory Visit | Attending: Cardiovascular Disease | Admitting: Cardiovascular Disease

## 2020-01-27 ENCOUNTER — Other Ambulatory Visit: Payer: Self-pay | Admitting: Primary Care

## 2020-01-27 ENCOUNTER — Other Ambulatory Visit: Payer: Self-pay

## 2020-01-27 DIAGNOSIS — R202 Paresthesia of skin: Secondary | ICD-10-CM | POA: Insufficient documentation

## 2020-01-27 DIAGNOSIS — R200 Anesthesia of skin: Secondary | ICD-10-CM | POA: Insufficient documentation

## 2020-01-27 DIAGNOSIS — M79604 Pain in right leg: Secondary | ICD-10-CM | POA: Diagnosis not present

## 2020-01-27 DIAGNOSIS — M79605 Pain in left leg: Secondary | ICD-10-CM | POA: Insufficient documentation

## 2020-01-28 ENCOUNTER — Ambulatory Visit (HOSPITAL_COMMUNITY): Payer: BC Managed Care – PPO

## 2020-01-28 ENCOUNTER — Telehealth: Payer: Self-pay | Admitting: Radiology

## 2020-01-28 ENCOUNTER — Other Ambulatory Visit: Payer: Self-pay

## 2020-01-28 ENCOUNTER — Other Ambulatory Visit (INDEPENDENT_AMBULATORY_CARE_PROVIDER_SITE_OTHER): Payer: BC Managed Care – PPO

## 2020-01-28 ENCOUNTER — Other Ambulatory Visit: Payer: Self-pay | Admitting: Primary Care

## 2020-01-28 DIAGNOSIS — R29898 Other symptoms and signs involving the musculoskeletal system: Secondary | ICD-10-CM | POA: Diagnosis not present

## 2020-01-28 DIAGNOSIS — M79605 Pain in left leg: Secondary | ICD-10-CM

## 2020-01-28 DIAGNOSIS — R7989 Other specified abnormal findings of blood chemistry: Secondary | ICD-10-CM

## 2020-01-28 LAB — CBC WITH DIFFERENTIAL/PLATELET
Basophils Absolute: 0.1 10*3/uL (ref 0.0–0.1)
Basophils Relative: 0.8 % (ref 0.0–3.0)
Eosinophils Absolute: 0.2 10*3/uL (ref 0.0–0.7)
Eosinophils Relative: 2 % (ref 0.0–5.0)
HCT: 53.3 % — ABNORMAL HIGH (ref 39.0–52.0)
Hemoglobin: 18.2 g/dL (ref 13.0–17.0)
Lymphocytes Relative: 18.1 % (ref 12.0–46.0)
Lymphs Abs: 2 10*3/uL (ref 0.7–4.0)
MCHC: 34.1 g/dL (ref 30.0–36.0)
MCV: 105.1 fl — ABNORMAL HIGH (ref 78.0–100.0)
Monocytes Absolute: 0.8 10*3/uL (ref 0.1–1.0)
Monocytes Relative: 7.6 % (ref 3.0–12.0)
Neutro Abs: 7.7 10*3/uL (ref 1.4–7.7)
Neutrophils Relative %: 71.5 % (ref 43.0–77.0)
Platelets: 268 10*3/uL (ref 150.0–400.0)
RBC: 5.07 Mil/uL (ref 4.22–5.81)
RDW: 15.8 % — ABNORMAL HIGH (ref 11.5–15.5)
WBC: 10.8 10*3/uL — ABNORMAL HIGH (ref 4.0–10.5)

## 2020-01-28 NOTE — Addendum Note (Signed)
Addended by: Aquilla Solian on: 01/28/2020 03:22 PM   Modules accepted: Orders

## 2020-01-28 NOTE — Telephone Encounter (Signed)
[facility] called a critical HGB - 18.2. Results given to Mayra Reel

## 2020-01-29 LAB — PATHOLOGIST SMEAR REVIEW

## 2020-01-29 NOTE — Telephone Encounter (Signed)
Delmer Islam, I put this on your desk.

## 2020-01-29 NOTE — Telephone Encounter (Signed)
Pt aware paperwork is ready for pick he is also aware he will need to turn in paperwork

## 2020-01-29 NOTE — Telephone Encounter (Signed)
Spoke to patient about this on 01/28/20. It appears that he's had an elevated HgB for a few years. For now we will work on addressing his acute lower extremity weakness and numbness.  He may need hematology evaluation vs phlebotomy in the future.

## 2020-01-30 ENCOUNTER — Ambulatory Visit: Payer: BC Managed Care – PPO

## 2020-02-02 ENCOUNTER — Other Ambulatory Visit: Payer: Self-pay

## 2020-02-02 ENCOUNTER — Ambulatory Visit
Admission: RE | Admit: 2020-02-02 | Discharge: 2020-02-02 | Disposition: A | Discharge status: Home / Self Care | Payer: BC Managed Care – PPO | Source: Ambulatory Visit | Attending: Primary Care | Admitting: Primary Care

## 2020-02-02 ENCOUNTER — Other Ambulatory Visit: Payer: Self-pay | Admitting: Primary Care

## 2020-02-02 DIAGNOSIS — M545 Low back pain: Secondary | ICD-10-CM | POA: Diagnosis not present

## 2020-02-02 DIAGNOSIS — M79605 Pain in left leg: Secondary | ICD-10-CM | POA: Diagnosis not present

## 2020-02-02 DIAGNOSIS — M79604 Pain in right leg: Secondary | ICD-10-CM | POA: Diagnosis not present

## 2020-02-02 DIAGNOSIS — R29898 Other symptoms and signs involving the musculoskeletal system: Secondary | ICD-10-CM | POA: Diagnosis not present

## 2020-02-04 DIAGNOSIS — R29898 Other symptoms and signs involving the musculoskeletal system: Secondary | ICD-10-CM | POA: Diagnosis not present

## 2020-02-09 NOTE — Telephone Encounter (Signed)
Copy for scan 

## 2020-02-11 NOTE — Telephone Encounter (Signed)
Dylan Paul, can we extend FMLA for one week?

## 2020-02-12 NOTE — Telephone Encounter (Signed)
Paperwork in [place] in box  Please intial and date changs

## 2020-02-16 NOTE — Telephone Encounter (Signed)
Pt called checking the status of his FMLA ppw. I advise Zella Ball has left for today so he is okay getting a call back tomorrow.  CB # [id number]

## 2020-02-17 ENCOUNTER — Other Ambulatory Visit: Payer: Self-pay | Admitting: Neurological Surgery

## 2020-02-17 DIAGNOSIS — R29898 Other symptoms and signs involving the musculoskeletal system: Secondary | ICD-10-CM

## 2020-02-17 NOTE — Telephone Encounter (Signed)
Pt aware.

## 2020-02-17 NOTE — Telephone Encounter (Signed)
Left message asking pt to call office Paperwork is ready for pick up pt will need to turn paperwork in

## 2020-02-19 DIAGNOSIS — R200 Anesthesia of skin: Secondary | ICD-10-CM | POA: Diagnosis not present

## 2020-02-19 DIAGNOSIS — M79604 Pain in right leg: Secondary | ICD-10-CM | POA: Diagnosis not present

## 2020-02-19 DIAGNOSIS — R202 Paresthesia of skin: Secondary | ICD-10-CM | POA: Diagnosis not present

## 2020-02-19 DIAGNOSIS — R29898 Other symptoms and signs involving the musculoskeletal system: Secondary | ICD-10-CM | POA: Diagnosis not present

## 2020-02-24 ENCOUNTER — Other Ambulatory Visit: Payer: Self-pay | Admitting: Neurology

## 2020-02-24 DIAGNOSIS — G35 Multiple sclerosis: Secondary | ICD-10-CM

## 2020-02-24 NOTE — Telephone Encounter (Signed)
Copy for pt °Copy for scan °

## 2020-03-08 ENCOUNTER — Inpatient Hospital Stay: Payer: BC Managed Care – PPO

## 2020-03-08 ENCOUNTER — Ambulatory Visit
Admission: RE | Admit: 2020-03-08 | Discharge: 2020-03-08 | Disposition: A | Discharge status: Home / Self Care | Payer: BC Managed Care – PPO | Source: Ambulatory Visit | Attending: Neurological Surgery | Admitting: Neurological Surgery

## 2020-03-08 ENCOUNTER — Other Ambulatory Visit: Payer: Self-pay

## 2020-03-08 ENCOUNTER — Inpatient Hospital Stay: Payer: BC Managed Care – PPO | Attending: Oncology | Admitting: Oncology

## 2020-03-08 VITALS — BP 133/103 | HR 81 | Temp 97.8°F | Wt 184.4 lb

## 2020-03-08 DIAGNOSIS — R634 Abnormal weight loss: Secondary | ICD-10-CM | POA: Diagnosis not present

## 2020-03-08 DIAGNOSIS — R202 Paresthesia of skin: Secondary | ICD-10-CM | POA: Diagnosis not present

## 2020-03-08 DIAGNOSIS — R531 Weakness: Secondary | ICD-10-CM | POA: Diagnosis not present

## 2020-03-08 DIAGNOSIS — D751 Secondary polycythemia: Secondary | ICD-10-CM | POA: Diagnosis not present

## 2020-03-08 DIAGNOSIS — R29898 Other symptoms and signs involving the musculoskeletal system: Secondary | ICD-10-CM

## 2020-03-08 DIAGNOSIS — K219 Gastro-esophageal reflux disease without esophagitis: Secondary | ICD-10-CM | POA: Insufficient documentation

## 2020-03-08 DIAGNOSIS — F17210 Nicotine dependence, cigarettes, uncomplicated: Secondary | ICD-10-CM | POA: Diagnosis not present

## 2020-03-08 DIAGNOSIS — Z8249 Family history of ischemic heart disease and other diseases of the circulatory system: Secondary | ICD-10-CM | POA: Diagnosis not present

## 2020-03-08 DIAGNOSIS — M5124 Other intervertebral disc displacement, thoracic region: Secondary | ICD-10-CM | POA: Diagnosis not present

## 2020-03-08 DIAGNOSIS — R200 Anesthesia of skin: Secondary | ICD-10-CM | POA: Diagnosis not present

## 2020-03-08 LAB — CBC WITH DIFFERENTIAL/PLATELET
Abs Immature Granulocytes: 0.04 10*3/uL (ref 0.00–0.07)
Basophils Absolute: 0.1 10*3/uL (ref 0.0–0.1)
Basophils Relative: 1 %
Eosinophils Absolute: 0.1 10*3/uL (ref 0.0–0.5)
Eosinophils Relative: 1 %
HCT: 56.1 % — ABNORMAL HIGH (ref 39.0–52.0)
Hemoglobin: 20 g/dL — ABNORMAL HIGH (ref 13.0–17.0)
Immature Granulocytes: 0 %
Lymphocytes Relative: 15 %
Lymphs Abs: 1.6 10*3/uL (ref 0.7–4.0)
MCH: 35.3 pg — ABNORMAL HIGH (ref 26.0–34.0)
MCHC: 35.7 g/dL (ref 30.0–36.0)
MCV: 98.9 fL (ref 80.0–100.0)
Monocytes Absolute: 0.7 10*3/uL (ref 0.1–1.0)
Monocytes Relative: 7 %
Neutro Abs: 8.3 10*3/uL — ABNORMAL HIGH (ref 1.7–7.7)
Neutrophils Relative %: 76 %
Platelets: 219 10*3/uL (ref 150–400)
RBC: 5.67 MIL/uL (ref 4.22–5.81)
RDW: 13.7 % (ref 11.5–15.5)
WBC: 10.8 10*3/uL — ABNORMAL HIGH (ref 4.0–10.5)
nRBC: 0 % (ref 0.0–0.2)

## 2020-03-08 LAB — COMPREHENSIVE METABOLIC PANEL
ALT: 31 U/L (ref 0–44)
AST: 37 U/L (ref 15–41)
Albumin: 3.6 g/dL (ref 3.5–5.0)
Alkaline Phosphatase: 76 U/L (ref 38–126)
Anion gap: 10 (ref 5–15)
BUN: <5 mg/dL — ABNORMAL LOW (ref 6–20)
CO2: 22 mmol/L (ref 22–32)
Calcium: 8.7 mg/dL — ABNORMAL LOW (ref 8.9–10.3)
Chloride: 106 mmol/L (ref 98–111)
Creatinine, Ser: 0.77 mg/dL (ref 0.61–1.24)
GFR, Estimated: >60 mL/min (ref >60)
Glucose, Bld: 107 mg/dL — ABNORMAL HIGH (ref 70–99)
Potassium: 3.7 mmol/L (ref 3.5–5.1)
Sodium: 138 mmol/L (ref 135–145)
Total Bilirubin: 1.6 mg/dL — ABNORMAL HIGH (ref 0.3–1.2)
Total Protein: 6.7 g/dL (ref 6.5–8.1)

## 2020-03-08 LAB — URINALYSIS, COMPLETE (UACMP) WITH MICROSCOPIC
Bacteria, UA: NONE SEEN
Bilirubin Urine: NEGATIVE
Glucose, UA: NEGATIVE mg/dL
Hgb urine dipstick: NEGATIVE
Ketones, ur: NEGATIVE mg/dL
Leukocytes,Ua: NEGATIVE
Nitrite: NEGATIVE
Protein, ur: NEGATIVE mg/dL
Specific Gravity, Urine: 1.018 (ref 1.005–1.030)
pH: 5 (ref 5.0–8.0)

## 2020-03-08 LAB — FOLATE: Folate: 7.3 ng/mL (ref >5.9)

## 2020-03-09 ENCOUNTER — Telehealth: Payer: Self-pay

## 2020-03-09 LAB — CARBON MONOXIDE, BLOOD (PERFORMED AT REF LAB): Carbon Monoxide, Blood: 5.3 % — ABNORMAL HIGH (ref 0.0–3.6)

## 2020-03-09 LAB — TESTOSTERONE: Testosterone: 401 ng/dL (ref 264–916)

## 2020-03-09 LAB — ERYTHROPOIETIN: Erythropoietin: 5.1 m[IU]/mL (ref 2.6–18.5)

## 2020-03-09 NOTE — Telephone Encounter (Signed)
Contacted patient to discuss scheduling him for weekly  Phlebotomy due to high hct level.  No answer, left message for patient to return call so that we can get him on our schedule as soon as possible.

## 2020-03-13 ENCOUNTER — Encounter: Payer: Self-pay | Admitting: Oncology

## 2020-03-13 LAB — JAK2 GENOTYPR

## 2020-03-13 NOTE — Progress Notes (Signed)
Hematology/Oncology Consult note [facility] [phone] [phone]  Patient Care Team: Doreene Nest, NP as PCP - General (Internal Medicine) Doreene Nest, NP (Internal Medicine)   Name of the patient: Dylan Paul  [id number]  [birth year]    Reason for referral-polycythemia   Referring physician-Dr. Malvin Johns  Date of visit: 03/13/20   History of presenting illness- Patient is a 34 year old male with a past medical history significant for GERD.  He was recently seen by Dr. Malvin Johns for symptoms of gait imbalance.  Patient does admit to drinking daily alcohol 6 pack beer 4-5 times a week.  He has also been smoking 1 pack of cigarettes per day for the last 10 years.  He has been referred to [name] for polycythemia.  Most recent CBC from 01/28/2020 showed white count of 10.8, H&H of 18.2/53.3 with an MCV of 105 and a platelet count of 268.  Patient reports having unintentional weight loss of about 10 pounds in the last 2 months.  ECOG PS- 1  Pain scale- 0   Review of systems- Review of Systems  Constitutional: Positive for malaise/fatigue and weight loss.  Neurological: Positive for seizures.    No Known Allergies  Patient Active Problem List   Diagnosis Date Noted  . Weakness of both lower extremities 01/22/2020  . Pain in both lower extremities 01/22/2020  . Decreased appetite 01/22/2020  . Gastroesophageal reflux disease 09/19/2016     Past Medical History:  Diagnosis Date  . GERD (gastroesophageal reflux disease)      Past Surgical History:  Procedure Laterality Date  . TONSILLECTOMY    . WISDOM TOOTH EXTRACTION      Social History   Socioeconomic History  . Marital status: Legally Separated    Spouse name: Not on file  . Number of children: Not on file  . Years of education: Not on file  . Highest education level: Not on file  Occupational History  . Not on file  Tobacco Use  . Smoking status: Current  Every Day Smoker    Packs/day: 1.00    Years: 10.00    Pack years: 10.00    Types: Cigarettes  . Smokeless tobacco: Never Used  Vaping Use  . Vaping Use: Never used  Substance and Sexual Activity  . Alcohol use: Yes    Comment: has a 6-pack of beer 4-5 times per week  . Drug use: Yes    Frequency: 7.0 times per week    Types: Marijuana  . Sexual activity: Yes  Other Topics Concern  . Not on file  Social History Narrative   ** Merged History Encounter **       Married. Wife pregnant. Works at [employer]. Enjoys playing video games.    Social Determinants of Health   Financial Resource Strain:   . Difficulty of Paying Living Expenses: Not on file  Food Insecurity:   . Worried About Programme researcher, broadcasting/film/video in the Last Year: Not on file  . Ran Out of Food in the Last Year: Not on file  Transportation Needs:   . Lack of Transportation (Medical): Not on file  . Lack of Transportation (Non-Medical): Not on file  Physical Activity:   . Days of Exercise per Week: Not on file  . Minutes of Exercise per Session: Not on file  Stress:   . Feeling of Stress : Not on file  Social Connections:   . Frequency of Communication with Friends and Family: Not  on file  . Frequency of Social Gatherings with Friends and Family: Not on file  . Attends Religious Services: Not on file  . Active Member of Clubs or Organizations: Not on file  . Attends Banker Meetings: Not on file  . Marital Status: Not on file  Intimate Partner Violence:   . Fear of Current or Ex-Partner: Not on file  . Emotionally Abused: Not on file  . Physically Abused: Not on file  . Sexually Abused: Not on file     Family History  Problem Relation Age of Onset  . Hypertension Mother   . Lupus Father        Skin  . Heart disease Maternal Grandmother   . Skin cancer Paternal Grandmother     No current outpatient medications on file.   Physical exam:  Vitals:   03/08/20 1052    BP: (!) 133/103  Pulse: 81  Temp: 97.8 F (36.6 C)  TempSrc: Tympanic  SpO2: 99%  Weight: 184 lb 6.4 oz (83.6 kg)   Physical Exam Cardiovascular:     Rate and Rhythm: Normal rate and regular rhythm.     Heart sounds: Normal heart sounds.  Pulmonary:     Effort: Pulmonary effort is normal.     Breath sounds: Normal breath sounds.  Abdominal:     General: Bowel sounds are normal. There is no distension.     Palpations: Abdomen is soft.     Tenderness: There is no abdominal tenderness.  Lymphadenopathy:     Comments: No palpable cervical, supraclavicular, axillary or inguinal adenopathy   Skin:    General: Skin is warm and dry.  Neurological:     Mental Status: He is alert and oriented to person, place, and time.        CMP Latest Ref Rng & Units 03/08/2020  Glucose 70 - 99 mg/dL 093(O)  BUN 6 - 20 mg/dL <6(Z)  Creatinine 1.24 - 1.24 mg/dL 5.80  Sodium 998 - 338 mmol/L 138  Potassium 3.5 - 5.1 mmol/L 3.7  Chloride 98 - 111 mmol/L 106  CO2 22 - 32 mmol/L 22  Calcium 8.9 - 10.3 mg/dL 2.5(K)  Total Protein 6.5 - 8.1 g/dL 6.7  Total Bilirubin 0.3 - 1.2 mg/dL 5.3(Z)  Alkaline Phos 38 - 126 U/L 76  AST 15 - 41 U/L 37  ALT 0 - 44 U/L 31   CBC Latest Ref Rng & Units 03/08/2020  WBC 4.0 - 10.5 K/uL 10.8(H)  Hemoglobin 13.0 - 17.0 g/dL 20.0(H)  Hematocrit 39 - 52 % 56.1(H)  Platelets 150 - 400 K/uL 219    No images are attached to the encounter.  MR THORACIC SPINE WO CONTRAST  Result Date: 03/09/2020 CLINICAL DATA:  Bilateral leg weakness. Numbness and tingling in legs. EXAM: MRI THORACIC SPINE WITHOUT CONTRAST TECHNIQUE: Multiplanar, multisequence MR imaging of the thoracic spine was performed. No intravenous contrast was administered. COMPARISON:  None. FINDINGS: Alignment:  Normal.  26 degrees of kyphosis at T7-8. Vertebrae: Mild anterior wedging T7, T8, T9 with associated endplate irregularity and Schmorl's nodes. No acute fracture or mass. Fatty bone marrow changes  at T7 without bone marrow edema. Cord:  Normal signal and morphology.  No cord compression. Paraspinal and other soft tissues: Negative for paraspinous mass or adenopathy. Disc levels: No abnormality above T6 T6-7: Moderately large central disc protrusion with cord flattening but no significant spinal stenosis. Disc degeneration with Schmorl's nodes and endplate irregularity. T7-8: Disc degeneration with small Schmorl's node.  Negative for disc protrusion or stenosis T8-9: Disc degeneration with endplate irregularity and small right paracentral disc protrusion. Slight cord flattening on the right without significant spinal or foraminal stenosis T9-10: Small right paracentral disc protrusion with mild cord flattening. Negative for spinal or foraminal stenosis. T10-11: Mild disc degeneration. Facet and ligamentum flavum hypertrophy on the left with mild left foraminal narrowing. T11-12: Small to moderate left sided disc protrusion with mild left foraminal narrowing. IMPRESSION: Multilevel degenerative change in the mid and lower thoracic spine as above. Multiple disc protrusions most prominent at T6-7 with cord flattening but no significant spinal stenosis. Mild anterior wedging T7, T8, T9 vertebral bodies with associated endplate irregularity and Schmorl's nodes. 26 degrees of kyphosis. Findings consistent with Scheuermann's disease. Electronically Signed   By: Marlan Palau M.D.   On: 03/09/2020 10:23    Assessment and plan- Patient is a 34 y.o. male referred for polycythemia  Discussed with patient's difference between polycythemia vera and secondary polycythemia.  Patient's polycythemia may be secondary due to smoking.  Today I will check a CBC with differential, CMP, JAK2 mutation testing, carbon monoxide levels, EPO levels also check urinalysis and serum testosterone level.  Patient denies any exogenous testosterone use.  Macrocytosis: Recent B12 TSH and folate were normal I suspect this is secondary to  Alcoholism.  Gait imbalance: Being worked up by neurology.  Although B12 and folic acid levels were normal.  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  I will see him in 2 weeks to discuss results of blood work and further management   Thank you for this kind referral and the opportunity to participate in the care of this  Patient   Visit Diagnosis 1. Polycythemia     Dr. Owens Shark, MD, MPH [facility] at [facility] [phone] 03/13/2020  5:57 PM

## 2020-03-16 ENCOUNTER — Telehealth: Payer: Self-pay | Admitting: *Deleted

## 2020-03-16 NOTE — Telephone Encounter (Signed)
Called patient's phone it went straight to voicemail.  We had called last week to see if he could get set up for phlebotomy since his hematocrit is 56 at this time we did not hear anything from him .  Today reached out again let him know that his hematocrit is 56 and he will be needing phlebotomies until it gets down to less than 50 per Dr. Smith Robert it will be somewhere between 250 mL to 500 mL that they will draw off of his blood.  Asked asked for him to give [name] a call and let us know that we could set it up or if he is not interested in this.  Gave direct telephone number to call

## 2020-03-18 ENCOUNTER — Ambulatory Visit
Admission: RE | Admit: 2020-03-18 | Discharge: 2020-03-18 | Disposition: A | Discharge status: Home / Self Care | Payer: BC Managed Care – PPO | Source: Ambulatory Visit | Attending: Neurology | Admitting: Neurology

## 2020-03-18 ENCOUNTER — Other Ambulatory Visit: Payer: Self-pay

## 2020-03-18 DIAGNOSIS — M79604 Pain in right leg: Secondary | ICD-10-CM | POA: Diagnosis not present

## 2020-03-18 DIAGNOSIS — R200 Anesthesia of skin: Secondary | ICD-10-CM | POA: Diagnosis not present

## 2020-03-18 DIAGNOSIS — R251 Tremor, unspecified: Secondary | ICD-10-CM | POA: Diagnosis not present

## 2020-03-18 DIAGNOSIS — M79605 Pain in left leg: Secondary | ICD-10-CM | POA: Diagnosis not present

## 2020-03-18 DIAGNOSIS — G35 Multiple sclerosis: Secondary | ICD-10-CM

## 2020-03-22 ENCOUNTER — Inpatient Hospital Stay (HOSPITAL_BASED_OUTPATIENT_CLINIC_OR_DEPARTMENT_OTHER): Payer: BC Managed Care – PPO | Admitting: Oncology

## 2020-03-22 ENCOUNTER — Other Ambulatory Visit: Payer: Self-pay

## 2020-03-22 DIAGNOSIS — R251 Tremor, unspecified: Secondary | ICD-10-CM | POA: Diagnosis not present

## 2020-03-22 DIAGNOSIS — D751 Secondary polycythemia: Secondary | ICD-10-CM

## 2020-03-22 DIAGNOSIS — R29898 Other symptoms and signs involving the musculoskeletal system: Secondary | ICD-10-CM | POA: Diagnosis not present

## 2020-03-22 DIAGNOSIS — M79604 Pain in right leg: Secondary | ICD-10-CM | POA: Diagnosis not present

## 2020-03-22 DIAGNOSIS — R262 Difficulty in walking, not elsewhere classified: Secondary | ICD-10-CM | POA: Diagnosis not present

## 2020-03-24 ENCOUNTER — Other Ambulatory Visit: Payer: Self-pay

## 2020-03-24 ENCOUNTER — Other Ambulatory Visit: Payer: Self-pay | Admitting: Oncology

## 2020-03-24 ENCOUNTER — Inpatient Hospital Stay: Payer: BC Managed Care – PPO

## 2020-03-24 VITALS — BP 108/81 | HR 95 | Temp 97.4°F | Resp 20

## 2020-03-24 DIAGNOSIS — D751 Secondary polycythemia: Secondary | ICD-10-CM | POA: Diagnosis not present

## 2020-03-24 DIAGNOSIS — R634 Abnormal weight loss: Secondary | ICD-10-CM | POA: Diagnosis not present

## 2020-03-24 DIAGNOSIS — F17210 Nicotine dependence, cigarettes, uncomplicated: Secondary | ICD-10-CM | POA: Diagnosis not present

## 2020-03-24 DIAGNOSIS — Z8249 Family history of ischemic heart disease and other diseases of the circulatory system: Secondary | ICD-10-CM | POA: Diagnosis not present

## 2020-03-24 DIAGNOSIS — K219 Gastro-esophageal reflux disease without esophagitis: Secondary | ICD-10-CM | POA: Diagnosis not present

## 2020-03-24 LAB — HEMOGLOBIN AND HEMATOCRIT, BLOOD
HCT: 56.6 % — ABNORMAL HIGH (ref 39.0–52.0)
Hemoglobin: 20.5 g/dL — ABNORMAL HIGH (ref 13.0–17.0)

## 2020-03-24 NOTE — Progress Notes (Signed)
Patient tolerated phlebotomy well. taken. Stable at discharge

## 2020-03-25 NOTE — Progress Notes (Signed)
I connected with Dylan Paul on 03/25/20 at  2:30 PM EDT by video enabled telemedicine visit and verified that I am speaking with the correct person using two identifiers.   I discussed the limitations, risks, security and privacy concerns of performing an evaluation and management service by telemedicine and the availability of in-person appointments. I also discussed with the patient that there may be a patient responsible charge related to this service. The patient expressed understanding and agreed to proceed.  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  He was recently seen by Dr. Melrose Nakayama for symptoms of gait imbalance.  Patient does admit to drinking daily alcohol 6 pack beer 4-5 times a week.  He has also been smoking 1 pack of cigarettes per day for the last 10 years.  He has been referred to [name] for polycythemia.  Most recent CBC from 01/28/2020 showed white count of 10.8, H&H of 18.2/53.3 with an MCV of 105 and a platelet count of 268.  Patient reports having unintentional weight loss of about 10 pounds in the last 2 months.  Results of blood work from 03/08/2020 were as follows: CBC showed white count of 10.8, H&H of 20/56.1 and a platelet count of 219.  EPO levels were normal at 5.1 and JAK2 mutation testing was negative.  Urinalysis was negative for hematuria.  Testosterone levels were normal.  CMP showed mildly elevated bilirubin of 1.6.  Carbon monoxide levels elevated at 5.3%  Interval history: Reports ongoing fatigue   Review of Systems  Constitutional: Positive for malaise/fatigue. Negative for chills, fever and weight loss.  HENT: Negative for congestion, ear  discharge and nosebleeds.   Eyes: Negative for blurred vision.  Respiratory: Negative for cough, hemoptysis, sputum production, shortness of breath and wheezing.   Cardiovascular: Negative for chest pain, palpitations, orthopnea and claudication.  Gastrointestinal: Negative for abdominal pain, blood in stool, constipation, diarrhea, heartburn, melena, nausea and vomiting.  Genitourinary: Negative for dysuria, flank pain, frequency, hematuria and urgency.  Musculoskeletal: Negative for back pain, joint pain and myalgias.  Skin: Negative for rash.  Neurological: Negative for dizziness, tingling, focal weakness, seizures, weakness and headaches.  Endo/Heme/Allergies: Does not bruise/bleed easily.  Psychiatric/Behavioral: Negative for depression and suicidal ideas. The patient does not have insomnia.     No Known Allergies  Past Medical History:  Diagnosis Date  . GERD (gastroesophageal reflux disease)     Past Surgical History:  Procedure Laterality Date  . TONSILLECTOMY    . WISDOM TOOTH EXTRACTION      Social History   Socioeconomic History  . Marital status: Legally Separated    Spouse name: Not on file  . Number of children: Not on file  . Years of education: Not on file  . Highest education level: Not on file  Occupational History  . Not on file  Tobacco Use  . Smoking status: Current Every Day Smoker    Packs/day: 1.00    Years: 10.00    Pack years: 10.00    Types: Cigarettes  . Smokeless tobacco: Never Used  Vaping Use  . Vaping Use: Never used  Substance and Sexual Activity  . Alcohol use: Yes    Comment: has a 6-pack of  beer 4-5 times per week  . Drug use: Yes    Frequency: 7.0 times per week    Types: Marijuana  . Sexual activity: Yes  Other Topics Concern  . Not on file  Social History Narrative   ** Merged History Encounter **       Married. Wife pregnant. Works at [employer]. Enjoys playing video games.    Social  Determinants of Health   Financial Resource Strain:   . Difficulty of Paying Living Expenses: Not on file  Food Insecurity:   . Worried About Charity fundraiser in the Last Year: Not on file  . Ran Out of Food in the Last Year: Not on file  Transportation Needs:   . Lack of Transportation (Medical): Not on file  . Lack of Transportation (Non-Medical): Not on file  Physical Activity:   . Days of Exercise per Week: Not on file  . Minutes of Exercise per Session: Not on file  Stress:   . Feeling of Stress : Not on file  Social Connections:   . Frequency of Communication with Friends and Family: Not on file  . Frequency of Social Gatherings with Friends and Family: Not on file  . Attends Religious Services: Not on file  . Active Member of Clubs or Organizations: Not on file  . Attends Archivist Meetings: Not on file  . Marital Status: Not on file  Intimate Partner Violence:   . Fear of Current or Ex-Partner: Not on file  . Emotionally Abused: Not on file  . Physically Abused: Not on file  . Sexually Abused: Not on file    Family History  Problem Relation Age of Onset  . Hypertension Mother   . Lupus Father        Skin  . Heart disease Maternal Grandmother   . Skin cancer Paternal Grandmother      Current Outpatient Medications:  .  gabapentin (NEURONTIN) 100 MG capsule, Take 100 mg by mouth in the morning and at bedtime. In one week take 200 mg twice daily, Disp: , Rfl:   MR BRAIN WO CONTRAST  Result Date: 03/18/2020 CLINICAL DATA:  Pain and numbness in both legs. Tremors, difficulty walking. EXAM: MRI HEAD WITHOUT CONTRAST TECHNIQUE: Multiplanar, multiecho pulse sequences of the brain and surrounding structures were obtained without intravenous contrast. COMPARISON:  None. FINDINGS: Brain: No acute infarction, hemorrhage, hydrocephalus, extra-axial collection or mass lesion. Focus of increased T2 signal within the white matter the right frontal operculum (series  15, image 5), nonspecific. Vascular: Normal flow voids. Skull and upper cervical spine: Normal marrow signal. Sinuses/Orbits: Prominent mucosal thickening within the left maxillary sinus and mild mucosal thickening throughout the remainder the paranasal sinuses. The orbits are maintained. Other: None. IMPRESSION: 1. No acute intracranial abnormality. 2. Focus of increased T2 signal within the white matter the right frontal operculum, nonspecific. Differential diagnosis include demyelinating disease, migraine headache, vasculitis or other infectious/inflammatory process. 3. Paranasal sinus disease, most pronounced in the left maxillary sinus. Electronically Signed   By: Pedro Earls M.D.   On: 03/18/2020 21:21   MR THORACIC SPINE WO CONTRAST  Result Date: 03/09/2020 CLINICAL DATA:  Bilateral leg weakness. Numbness and tingling in legs. EXAM: MRI THORACIC SPINE WITHOUT CONTRAST TECHNIQUE: Multiplanar, multisequence MR imaging of the thoracic spine was performed. No intravenous contrast was administered. COMPARISON:  None. FINDINGS: Alignment:  Normal.  26 degrees of kyphosis at T7-8. Vertebrae: Mild anterior wedging T7, T8, T9 with  associated endplate irregularity and Schmorl's nodes. No acute fracture or mass. Fatty bone marrow changes at T7 without bone marrow edema. Cord:  Normal signal and morphology.  No cord compression. Paraspinal and other soft tissues: Negative for paraspinous mass or adenopathy. Disc levels: No abnormality above T6 T6-7: Moderately large central disc protrusion with cord flattening but no significant spinal stenosis. Disc degeneration with Schmorl's nodes and endplate irregularity. T7-8: Disc degeneration with small Schmorl's node. Negative for disc protrusion or stenosis T8-9: Disc degeneration with endplate irregularity and small right paracentral disc protrusion. Slight cord flattening on the right without significant spinal or foraminal stenosis T9-10: Small right  paracentral disc protrusion with mild cord flattening. Negative for spinal or foraminal stenosis. T10-11: Mild disc degeneration. Facet and ligamentum flavum hypertrophy on the left with mild left foraminal narrowing. T11-12: Small to moderate left sided disc protrusion with mild left foraminal narrowing. IMPRESSION: Multilevel degenerative change in the mid and lower thoracic spine as above. Multiple disc protrusions most prominent at T6-7 with cord flattening but no significant spinal stenosis. Mild anterior wedging T7, T8, T9 vertebral bodies with associated endplate irregularity and Schmorl's nodes. 26 degrees of kyphosis. Findings consistent with Scheuermann's disease. Electronically Signed   By: Franchot Gallo M.D.   On: 03/09/2020 10:23    No images are attached to the encounter.   CMP Latest Ref Rng & Units 03/08/2020  Glucose 70 - 99 mg/dL 107(H)  BUN 6 - 20 mg/dL <5(L)  Creatinine 0.61 - 1.24 mg/dL 0.77  Sodium 135 - 145 mmol/L 138  Potassium 3.5 - 5.1 mmol/L 3.7  Chloride 98 - 111 mmol/L 106  CO2 22 - 32 mmol/L 22  Calcium 8.9 - 10.3 mg/dL 8.7(L)  Total Protein 6.5 - 8.1 g/dL 6.7  Total Bilirubin 0.3 - 1.2 mg/dL 1.6(H)  Alkaline Phos 38 - 126 U/L 76  AST 15 - 41 U/L 37  ALT 0 - 44 U/L 31   CBC Latest Ref Rng & Units 03/24/2020  WBC 4.0 - 10.5 K/uL -  Hemoglobin 13.0 - 17.0 g/dL 20.5(H)  Hematocrit 39 - 52 % 56.6(H)  Platelets 150 - 400 K/uL -     Observation/objective: Appears in no acute distress over video visit today.  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  I will plan to check CAL R and MPL mutations when I see him back in 3 months.  Given that his hemoglobin is 20 with a hematocrit of 56 I will start him on weekly phlebotomy To keep his hematocrit less than 50.  If  his hemoglobin and hematocrit rises relatively fast in few weeks to months I will consider doing a bone marrow biopsy.  However if it takes several months to go up it is likely secondary to smoking.  I have again encouraged patient to abstain from smoking.  Discussed differences between polycythemia vera and secondary polycythemia.  Risk of thromboembolic episodes is not significantly increased and secondary polycythemia.  Elevated bilirubin: Likely secondary to alcohol use and his neuropathy symptoms can also be secondary to alcohol.  Follow-up instructions: Continue weekly phlebotomies and H&H every week and I will see him back in 3 months with CBC with differential CMP  CALR and MPL mutations I discussed the assessment and treatment plan with the patient. The patient was provided an opportunity to ask  questions and all were answered. The patient agreed with the plan and demonstrated an understanding of the instructions.   The patient was advised to call back or seek an in-person evaluation if the symptoms worsen or if the condition fails to improve as anticipated.    Visit Diagnosis: 1. Polycythemia     Dr. Randa Evens, MD, MPH [place] [facility] Tel- [phone] 03/25/2020 1:13 PM

## 2020-03-30 ENCOUNTER — Telehealth: Payer: Self-pay | Admitting: *Deleted

## 2020-03-30 NOTE — Telephone Encounter (Signed)
Called patient again today and got his voicemail and left a message stating that we have not heard from him as to whether or not he wants to do phlebotomies for his hematocrit that was 56.  If patient is interested for him to give [name] a call and we will set up appointments for him or if patient is not interested if he can call us to let us know that he does not want to.  Left my direct number for him to call

## 2020-04-07 ENCOUNTER — Inpatient Hospital Stay: Payer: BC Managed Care – PPO

## 2020-04-08 ENCOUNTER — Inpatient Hospital Stay: Payer: BC Managed Care – PPO | Attending: Oncology

## 2020-04-08 ENCOUNTER — Inpatient Hospital Stay: Payer: BC Managed Care – PPO

## 2020-04-08 ENCOUNTER — Other Ambulatory Visit: Payer: Self-pay

## 2020-04-08 VITALS — BP 108/74 | HR 109 | Temp 97.2°F | Resp 18

## 2020-04-08 DIAGNOSIS — D751 Secondary polycythemia: Secondary | ICD-10-CM | POA: Insufficient documentation

## 2020-04-08 LAB — HEMOGLOBIN AND HEMATOCRIT, BLOOD
HCT: 54.9 % — ABNORMAL HIGH (ref 39.0–52.0)
Hemoglobin: 19.4 g/dL — ABNORMAL HIGH (ref 13.0–17.0)

## 2020-04-08 NOTE — Progress Notes (Signed)
Per MD order goal HCT is <50.  Today's HCT 54.9.  Therapeutic phlebotomy performed per MD order, removing 350cc using 20 gauge PIV in left AC. Pt tolerated procedure well, drink provided. Pt and VS stable at discharge.

## 2020-04-12 DIAGNOSIS — R251 Tremor, unspecified: Secondary | ICD-10-CM | POA: Diagnosis not present

## 2020-04-12 DIAGNOSIS — M25571 Pain in right ankle and joints of right foot: Secondary | ICD-10-CM | POA: Diagnosis not present

## 2020-04-12 DIAGNOSIS — R200 Anesthesia of skin: Secondary | ICD-10-CM | POA: Diagnosis not present

## 2020-04-12 DIAGNOSIS — M79671 Pain in right foot: Secondary | ICD-10-CM | POA: Diagnosis not present

## 2020-04-25 ENCOUNTER — Inpatient Hospital Stay: Payer: BC Managed Care – PPO

## 2020-05-05 ENCOUNTER — Inpatient Hospital Stay: Payer: BC Managed Care – PPO | Attending: Oncology

## 2020-05-05 ENCOUNTER — Inpatient Hospital Stay: Payer: BC Managed Care – PPO

## 2020-05-05 VITALS — BP 132/85 | HR 70 | Temp 96.5°F | Resp 18

## 2020-05-05 DIAGNOSIS — D751 Secondary polycythemia: Secondary | ICD-10-CM | POA: Diagnosis not present

## 2020-05-05 LAB — HEMOGLOBIN AND HEMATOCRIT, BLOOD
HCT: 50.1 % (ref 39.0–52.0)
Hemoglobin: 17.4 g/dL — ABNORMAL HIGH (ref 13.0–17.0)

## 2020-05-05 NOTE — Progress Notes (Signed)
1537- Patient tolerated phlebotomy procedure well. Patient and vital signs stable. Patient discharged to home at this time.

## 2020-05-17 ENCOUNTER — Inpatient Hospital Stay: Payer: BC Managed Care – PPO

## 2020-05-19 ENCOUNTER — Inpatient Hospital Stay: Payer: BC Managed Care – PPO

## 2020-06-02 ENCOUNTER — Inpatient Hospital Stay: Payer: BC Managed Care – PPO | Attending: Oncology

## 2020-06-02 ENCOUNTER — Inpatient Hospital Stay: Payer: BC Managed Care – PPO

## 2020-06-13 ENCOUNTER — Inpatient Hospital Stay: Payer: BC Managed Care – PPO

## 2020-06-23 ENCOUNTER — Ambulatory Visit: Payer: BC Managed Care – PPO | Admitting: Oncology

## 2020-06-23 ENCOUNTER — Other Ambulatory Visit: Payer: BC Managed Care – PPO

## 2020-06-27 ENCOUNTER — Telehealth: Payer: Self-pay | Admitting: Oncology

## 2020-06-27 ENCOUNTER — Inpatient Hospital Stay: Payer: BC Managed Care – PPO | Admitting: Oncology

## 2020-06-27 ENCOUNTER — Inpatient Hospital Stay: Payer: BC Managed Care – PPO

## 2020-06-27 ENCOUNTER — Other Ambulatory Visit: Payer: BC Managed Care – PPO

## 2020-06-27 NOTE — Telephone Encounter (Signed)
Called pt to r/s missed appointments on 06/27/20. No answer/mailbox full.

## 2020-07-06 ENCOUNTER — Telehealth: Payer: Self-pay | Admitting: Oncology

## 2020-07-06 NOTE — Telephone Encounter (Signed)
Called pt to make him aware of r/s appts from no show on 06/27/20. Mailbox full and unable to leave message. Mailing AVS and will try again at a later time.

## 2020-07-13 DIAGNOSIS — M79604 Pain in right leg: Secondary | ICD-10-CM | POA: Diagnosis not present

## 2020-07-13 DIAGNOSIS — R200 Anesthesia of skin: Secondary | ICD-10-CM | POA: Diagnosis not present

## 2020-07-13 DIAGNOSIS — M7989 Other specified soft tissue disorders: Secondary | ICD-10-CM | POA: Diagnosis not present

## 2020-07-13 DIAGNOSIS — R531 Weakness: Secondary | ICD-10-CM | POA: Diagnosis not present

## 2020-07-15 ENCOUNTER — Telehealth: Payer: Self-pay | Admitting: Oncology

## 2020-07-15 NOTE — Telephone Encounter (Signed)
07/15/2020 Attempted to call pt and inform him that missed appts have been r/s for 08/08/20, but was unable to reach him or leave a VM; mailbox full. MyChart message was sent w/ appt info; pt last active on 07/06/20 SRW

## 2020-07-19 DIAGNOSIS — M79604 Pain in right leg: Secondary | ICD-10-CM | POA: Diagnosis not present

## 2020-07-19 DIAGNOSIS — R29898 Other symptoms and signs involving the musculoskeletal system: Secondary | ICD-10-CM | POA: Diagnosis not present

## 2020-07-19 DIAGNOSIS — M79605 Pain in left leg: Secondary | ICD-10-CM | POA: Diagnosis not present

## 2020-08-08 ENCOUNTER — Inpatient Hospital Stay: Payer: BC Managed Care – PPO | Attending: Oncology

## 2020-08-08 ENCOUNTER — Encounter: Payer: Self-pay | Admitting: Oncology

## 2020-08-08 ENCOUNTER — Inpatient Hospital Stay: Payer: BC Managed Care – PPO | Admitting: Oncology

## 2020-08-08 ENCOUNTER — Inpatient Hospital Stay: Payer: BC Managed Care – PPO

## 2020-10-06 DIAGNOSIS — M79604 Pain in right leg: Secondary | ICD-10-CM | POA: Diagnosis not present

## 2020-10-06 DIAGNOSIS — R262 Difficulty in walking, not elsewhere classified: Secondary | ICD-10-CM | POA: Diagnosis not present

## 2020-10-06 DIAGNOSIS — R29898 Other symptoms and signs involving the musculoskeletal system: Secondary | ICD-10-CM | POA: Diagnosis not present

## 2020-10-06 DIAGNOSIS — R200 Anesthesia of skin: Secondary | ICD-10-CM | POA: Diagnosis not present

## 2021-03-05 IMAGING — MR MR HEAD W/O CM
11 series · 48 of 48 positions shown · non-contrast
Comparison: None.

CLINICAL DATA: Pain and numbness in both legs. Tremors, difficulty
walking.

EXAM:
MRI HEAD WITHOUT CONTRAST
TECHNIQUE: Multiplanar, multiecho pulse sequences of the brain and surrounding
structures were obtained without intravenous contrast.

[Series 5: T1 · sagittal · 4.0mm · 0.94mm/px · 3 of 31 slices shown (1 of 2)]
[im 1/31]
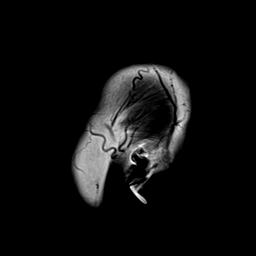
[im 16/31]
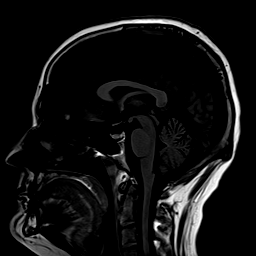
[im 31/31]
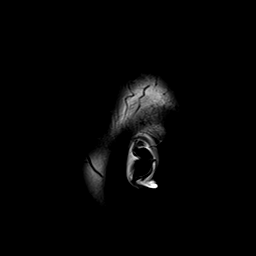

[Series 6: DWI · axial · 3.0mm · 1.44mm/px · z∈[-45,+104]mm · 7 of 94 slices shown (1 of 4)]
[im 1/94]
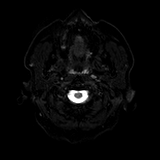
[im 16/94]
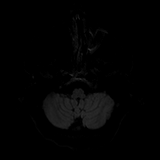
[im 32/94]
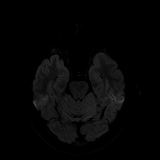
[im 47/94]
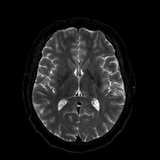
[im 63/94]
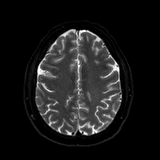
[im 78/94]
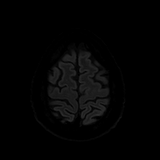
[im 94/94]
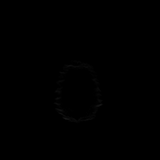

[Series 7: DWI · axial · 3.0mm · 1.44mm/px · z∈[-45,+104]mm · 3 of 46 slices shown (2 of 4)]
[im 1/46]
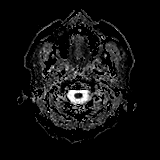
[im 23/46]
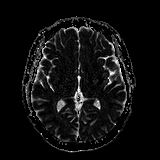
[im 46/46]
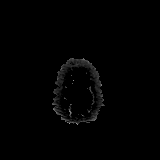

[Series 8: DWI · coronal · 5.0mm · 1.44mm/px · 5 of 70 slices shown (3 of 4)]
[im 1/70]
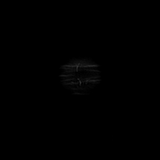
[im 18/70]
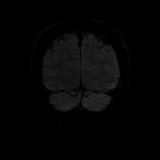
[im 35/70]
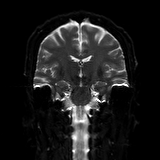
[im 52/70]
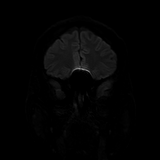
[im 70/70]
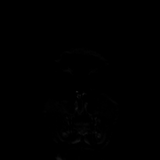

[Series 9: DWI · coronal · 5.0mm · 1.44mm/px · 3 of 35 slices shown (4 of 4)]
[im 1/35]
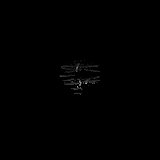
[im 18/35]
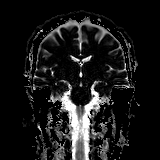
[im 35/35]
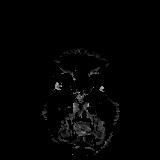

[Series 10: T2 · axial · 4.0mm · 0.36mm/px · z∈[-45,+98]mm · 2 of 29 slices shown (1 of 2)]
[im 1/29]
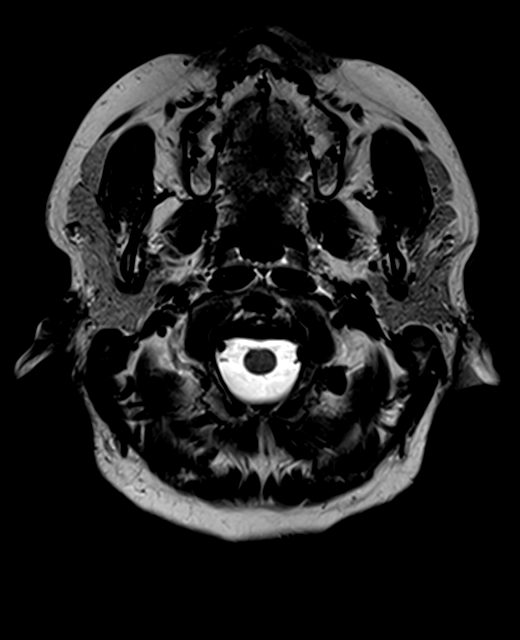
[im 29/29]
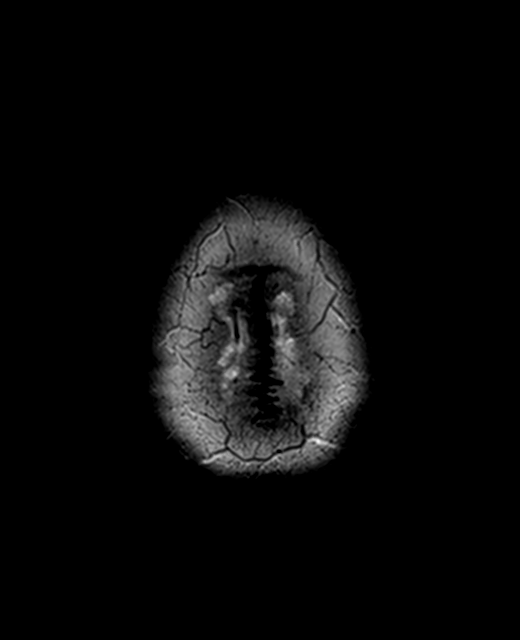

[Series 11: FLAIR · axial · 3.0mm · 0.72mm/px · z∈[-48,+100]mm · 2 of 26 slices shown (1 of 2)]
[im 1/26]
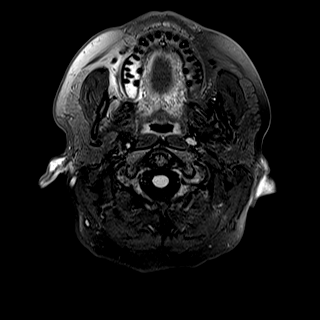
[im 26/26]
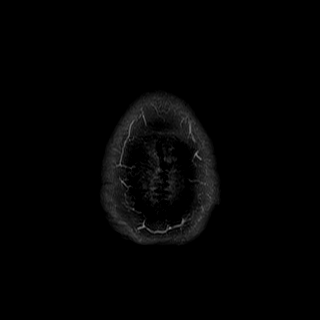

[Series 13: swi_images · axial · 1.5mm · 0.90mm/px · z∈[-44,+96]mm · 7 of 96 slices shown]
[im 1/96]
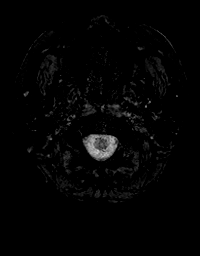
[im 16/96]
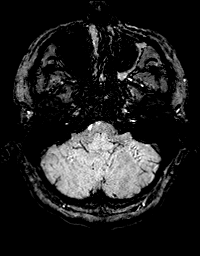
[im 32/96]
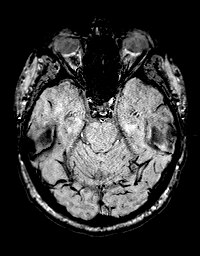
[im 48/96]
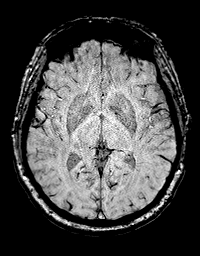
[im 64/96]
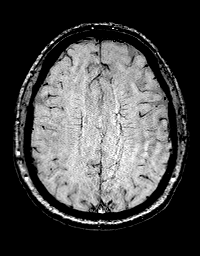
[im 80/96]
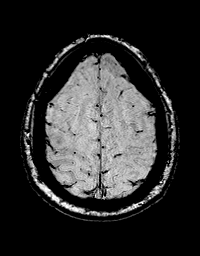
[im 96/96]
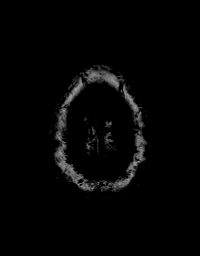

[Series 14: T1 · axial · 1.0mm · 0.94mm/px · z∈[-60,+98]mm · 12 of 160 slices shown (2 of 2)]
[im 1/160]
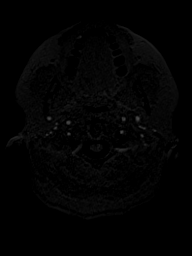
[im 15/160]
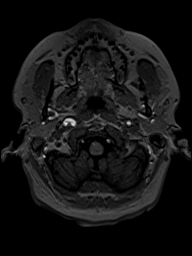
[im 29/160]
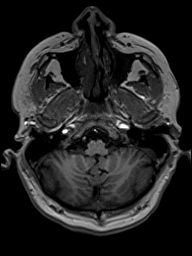
[im 44/160]
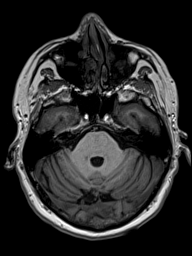
[im 58/160]
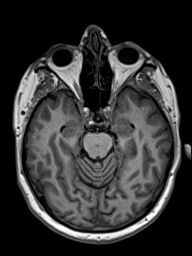
[im 73/160]
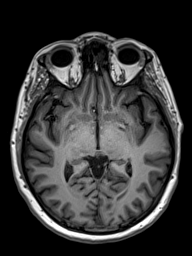
[im 87/160]
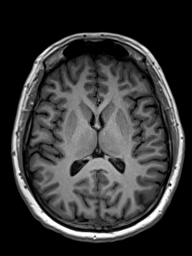
[im 102/160]
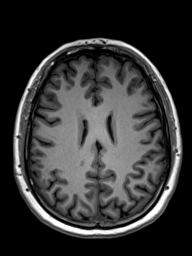
[im 116/160]
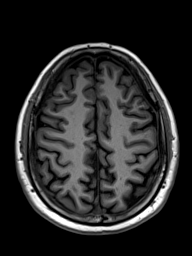
[im 131/160]
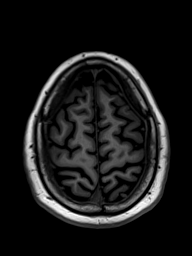
[im 145/160]
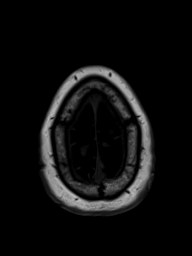
[im 160/160]
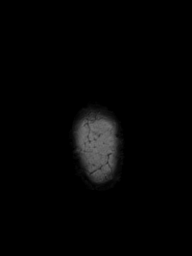

[Series 15: FLAIR · sagittal · 4.0mm · 0.90mm/px · 2 of 25 slices shown (2 of 2)]
[im 1/25]
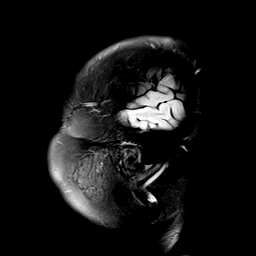
[im 25/25]
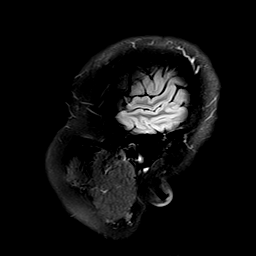

[Series 16: T2 · coronal · 4.5mm · 0.45mm/px · 2 of 33 slices shown (2 of 2)]
[im 1/33]
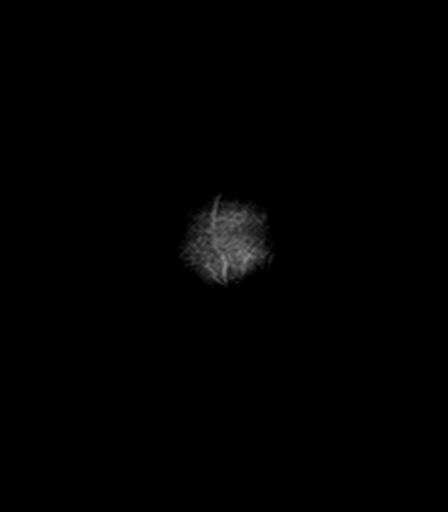
[im 33/33]
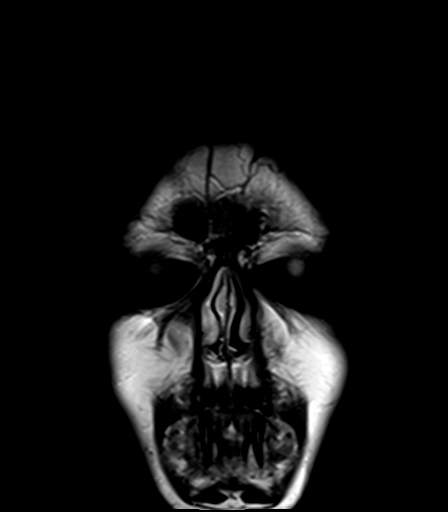

[48 of 48 positions shown; findings below may reference images not displayed]

FINDINGS: Brain: No acute infarction, hemorrhage, hydrocephalus, extra-axial
collection or mass lesion.

Focus of increased T2 signal within the white matter the right
frontal operculum (series 15, image 5), nonspecific.

Vascular: Normal flow voids.

Skull and upper cervical spine: Normal marrow signal.

Sinuses/Orbits: Prominent mucosal thickening within the left
maxillary sinus and mild mucosal thickening throughout the remainder
the paranasal sinuses. The orbits are maintained.

Other: None.
IMPRESSION: 1. No acute intracranial abnormality.
2. Focus of increased T2 signal within the white matter the right
frontal operculum, nonspecific. Differential diagnosis include
demyelinating disease, migraine headache, vasculitis or other
infectious/inflammatory process.
3. Paranasal sinus disease, most pronounced in the left maxillary
sinus.

## 2021-08-09 ENCOUNTER — Ambulatory Visit (INDEPENDENT_AMBULATORY_CARE_PROVIDER_SITE_OTHER): Payer: BC Managed Care – PPO

## 2021-08-09 ENCOUNTER — Encounter: Payer: Self-pay | Admitting: Emergency Medicine

## 2021-08-09 ENCOUNTER — Ambulatory Visit: Payer: Self-pay

## 2021-08-09 ENCOUNTER — Ambulatory Visit
Admission: EM | Admit: 2021-08-09 | Discharge: 2021-08-09 | Disposition: A | Discharge status: Home / Self Care | Payer: BC Managed Care – PPO | Attending: Emergency Medicine | Admitting: Emergency Medicine

## 2021-08-09 DIAGNOSIS — M25461 Effusion, right knee: Secondary | ICD-10-CM | POA: Diagnosis not present

## 2021-08-09 DIAGNOSIS — M25561 Pain in right knee: Secondary | ICD-10-CM

## 2021-08-09 MED ORDER — IBUPROFEN 600 MG PO TABS: 600.0000 mg | ORAL_TABLET | Freq: Four times a day (QID) | ORAL | 0 refills | Status: DC | PRN

## 2021-08-09 NOTE — Discharge Instructions (Addendum)
Take the ibuprofen as prescribed.  Rest and elevate your knee.  Apply ice packs 2-3 times a day for up to 20 minutes each.  Wear the ace wrap as needed for comfort.   ? ?Follow up with an orthopedist.   ? ?

## 2021-08-09 NOTE — ED Triage Notes (Signed)
Pt presents with right knee swelling and pain x 3 days. ?

## 2021-08-09 NOTE — ED Provider Notes (Signed)
?[facility] ? ? ? ?CSN: [id number] ?Arrival date & time: 08/09/21  1149 ? ? ?  ? ?History   ?Chief Complaint ?Chief Complaint  ?Patient presents with  ? Knee Pain  ? ? ?HPI ?Dylan Paul is a 36 y.o. male.  Patient presents with 3-day history of right knee pain and swelling.  No known injury.  No bruising, redness, wounds, fever, or other symptoms.  Treatment at home with rest and elevation.  He has a history of weakness and pain in both lower extremities; his symptoms are at baseline.  He is followed by [facility] neurology for numbness and tingling in his legs; last seen on 10/06/2020. ? ?The history is provided by the patient and medical records.  ? ?Past Medical History:  ?Diagnosis Date  ? GERD (gastroesophageal reflux disease)   ? ? ?Patient Active Problem List  ? Diagnosis Date Noted  ? 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 Weakness of both lower extremities 01/22/2020  ? Pain in both lower extremities 01/22/2020  ? Decreased appetite 01/22/2020  ? Gastroesophageal reflux disease 09/19/2016  ? ? ?Past Surgical History:  ?Procedure Laterality Date  ? TONSILLECTOMY    ? WISDOM TOOTH EXTRACTION    ? ? ? ? ? ?Home Medications   ? ?Prior to Admission medications   ?Medication Sig Start Date End Date Taking? Authorizing Provider  ?ibuprofen (ADVIL) 600 MG tablet Take 1 tablet (600 mg total) by mouth every 6 (six) hours as needed. 08/09/21  Yes Mickie Bail, NP  ?gabapentin (NEURONTIN) 100 MG capsule Take 100 mg by mouth in the morning and at bedtime. In one week take 200 mg twice daily    [provider]  ? ? ?Family History ?Family History  ?Problem Relation Age of Onset  ? Hypertension Mother   ? Lupus Father   ?     Skin  ? Heart disease Maternal Grandmother   ? Skin cancer Paternal Grandmother   ? ? ?Social History ?Social History  ? ?Tobacco Use  ? Smoking status: Every Day  ?  Packs/day: 1.00  ?  Years: 10.00  ?  Pack years: 10.00  ?  Types: Cigarettes  ? Smokeless tobacco:  Never  ?Vaping Use  ? Vaping Use: Never used  ?Substance Use Topics  ? Alcohol use: Yes  ?  Comment: has a 6-pack of beer 4-5 times per week  ? Drug use: Yes  ?  Frequency: 7.0 times per week  ?  Types: Marijuana  ? ? ? ?Allergies   ?Patient has no known allergies. ? ? ?Review of Systems ?Review of Systems  ?Constitutional:  Negative for chills and fever.  ?Musculoskeletal:  Positive for arthralgias, gait problem and joint swelling.  ?Skin:  Negative for color change, rash and wound.  ?All other systems reviewed and are negative. ? ? ?Physical Exam ?Triage Vital Signs ?ED Triage Vitals  ?Enc Vitals Group  ?   BP 08/09/21 1205 137/90  ?   Pulse Rate 08/09/21 1201 87  ?   Resp 08/09/21 1201 18  ?   Temp 08/09/21 1201 97.9 ?F (36.6 ?C)  ?   Temp src --   ?   SpO2 08/09/21 1201 99 %  ?   Weight --   ?   Height --   ?   Head Circumference --   ?   Peak Flow --   ?   Pain Score 08/09/21 1202 0  ?   Pain Loc --   ?  Pain Edu? --   ?   Excl. in GC? --   ? ?No data found. ? ?Updated Vital Signs ?BP 137/90 (BP Location: Right Arm)   Pulse 87   Temp 97.9 ?F (36.6 ?C)   Resp 18   SpO2 99%  ? ?Visual Acuity ?Right Eye Distance:   ?Left Eye Distance:   ?Bilateral Distance:   ? ?Right Eye Near:   ?Left Eye Near:    ?Bilateral Near:    ? ?Physical Exam ?Vitals and nursing note reviewed.  ?Constitutional:   ?   General: He is not in acute distress. ?   Appearance: He is well-developed. He is not ill-appearing.  ?HENT:  ?   Mouth/Throat:  ?   Mouth: Mucous membranes are moist.  ?Cardiovascular:  ?   Rate and Rhythm: Normal rate and regular rhythm.  ?   Heart sounds: Normal heart sounds.  ?Pulmonary:  ?   Effort: Pulmonary effort is normal. No respiratory distress.  ?   Breath sounds: Normal breath sounds.  ?Musculoskeletal:     ?   General: Swelling and tenderness present. No deformity. Normal range of motion.  ?   Cervical back: Neck supple.  ?     Legs: ? ?   Comments: Anterior knee tender to palpation and moderately  edematous. No erythema, ecchymosis, wounds.   ?Skin: ?   General: Skin is warm and dry.  ?   Capillary Refill: Capillary refill takes less than 2 seconds.  ?   Findings: No bruising, erythema, lesion or rash.  ?Neurological:  ?   General: No focal deficit present.  ?   Mental Status: He is alert and oriented to person, place, and time.  ?   Sensory: No sensory deficit.  ?   Motor: No weakness.  ?   Gait: Gait abnormal.  ?Psychiatric:     ?   Mood and Affect: Mood normal.     ?   Behavior: Behavior normal.  ? ? ? ?UC Treatments / Results  ?Labs ?(all labs ordered are listed, but only abnormal results are displayed) ?Labs Reviewed - No data to display ? ?EKG ? ? ?Radiology ?DG Knee Complete 4 Views Right ? ?Result Date: 08/09/2021 ?CLINICAL DATA:  Right knee pain and swelling. EXAM: RIGHT KNEE - COMPLETE 4+ VIEW COMPARISON:  None. FINDINGS: There is mildly decreased bone mineralization. Mild medial compartment joint space narrowing and peripheral osteophytosis. No acute fracture is seen. No dislocation. IMPRESSION: Mild medial compartment osteoarthritis. Electronically Signed   By: Neita Garnet M.D.   On: 08/09/2021 12:31   ? ?Procedures ?Procedures (including critical care time) ? ?Medications Ordered in UC ?Medications - No data to display ? ?Initial Impression / Assessment and Plan / UC Course  ?I have reviewed the triage vital signs and the nursing notes. ? ?Pertinent labs & imaging results that were available during my care of the patient were reviewed by me and considered in my medical decision making (see chart for details). ? ?Pain and swelling of right knee.  X-ray negative for fracture or dislocation.  Treating with rest, elevation, ice packs, ibuprofen, Ace wrap.  Instructed patient to follow-up with an orthopedist and contact information given for the on-call orthopedic practice.  Patient agrees to plan of care. ? ? ?Final Clinical Impressions(s) / UC Diagnoses  ? ?Final diagnoses:  ?Pain and swelling of  right knee  ? ? ? ?Discharge Instructions   ? ?  ?Take the ibuprofen as prescribed.  Rest  and elevate your knee.  Apply ice packs 2-3 times a day for up to 20 minutes each.  Wear the ace wrap as needed for comfort.   ? ?Follow up with an orthopedist.   ? ? ? ? ? ?ED Prescriptions   ? ? Medication Sig Dispense Auth. Provider  ? ibuprofen (ADVIL) 600 MG tablet Take 1 tablet (600 mg total) by mouth every 6 (six) hours as needed. 30 tablet Mickie Bail, NP  ? ?  ? ?I have reviewed the PDMP during this encounter. ?  ?Mickie Bail, NP ?08/09/21 1242 ? ?

## 2021-08-16 DIAGNOSIS — M7051 Other bursitis of knee, right knee: Secondary | ICD-10-CM | POA: Diagnosis not present

## 2021-08-16 DIAGNOSIS — M25561 Pain in right knee: Secondary | ICD-10-CM | POA: Diagnosis not present

## 2021-10-12 ENCOUNTER — Ambulatory Visit
Admission: EM | Admit: 2021-10-12 | Discharge: 2021-10-12 | Disposition: A | Discharge status: Home / Self Care | Payer: BC Managed Care – PPO | Attending: Emergency Medicine | Admitting: Emergency Medicine

## 2021-10-12 ENCOUNTER — Encounter: Payer: Self-pay | Admitting: Emergency Medicine

## 2021-10-12 DIAGNOSIS — T2220XA Burn of second degree of shoulder and upper limb, except wrist and hand, unspecified site, initial encounter: Secondary | ICD-10-CM | POA: Diagnosis not present

## 2021-10-12 MED ORDER — SILVER SULFADIAZINE 1 % EX CREA: 1.0000 "application " | TOPICAL_CREAM | Freq: Every day | CUTANEOUS | 0 refills | Status: DC

## 2021-10-12 MED ORDER — DOXYCYCLINE HYCLATE 100 MG PO CAPS: 100.0000 mg | ORAL_CAPSULE | Freq: Two times a day (BID) | ORAL | 0 refills | Status: DC

## 2021-10-12 NOTE — ED Provider Notes (Signed)
Renaldo Fiddler    CSN: [id number] Arrival date & time: 10/12/21  9937      History   Chief Complaint Chief Complaint  Patient presents with   Burn    HPI Dylan Paul is a 36 y.o. male.   Patient presents with a burn to the left forearm occurring 7 days ago.  Endorses that he was grilling when his arm brushed against it.  Burn initially had blisters which patient opened up and drained.  Concern it area is healing appropriately.  Site is still painful.  Denies fever, chills or drainage.  Has been cleansing with sunburn cream and a lidocaine relief, covering with a nonadherent.  Past Medical History:  Diagnosis Date   GERD (gastroesophageal reflux disease)     Patient Active Problem List   Diagnosis Date Noted   Polycythemia, secondary 03/24/2020   Weakness of both lower extremities 01/22/2020   Pain in both lower extremities 01/22/2020   Decreased appetite 01/22/2020   Gastroesophageal reflux disease 09/19/2016    Past Surgical History:  Procedure Laterality Date   TONSILLECTOMY     WISDOM TOOTH EXTRACTION         Home Medications    Prior to Admission medications   Medication Sig Start Date End Date Taking? Authorizing Provider  doxycycline (VIBRAMYCIN) 100 MG capsule Take 1 capsule (100 mg total) by mouth 2 (two) times daily. 10/12/21  Yes Huck Ashworth R, NP  silver sulfADIAZINE (SILVADENE) 1 % cream Apply 1 application. topically daily. 10/12/21  Yes Albino Bufford R, NP  gabapentin (NEURONTIN) 100 MG capsule Take 100 mg by mouth in the morning and at bedtime. In one week take 200 mg twice daily    [provider]  ibuprofen (ADVIL) 600 MG tablet Take 1 tablet (600 mg total) by mouth every 6 (six) hours as needed. 08/09/21   Mickie Bail, NP    Family History Family History  Problem Relation Age of Onset   Hypertension Mother    Lupus Father        Skin   Heart disease Maternal Grandmother    Skin cancer Paternal  Grandmother     Social History Social History   Tobacco Use   Smoking status: Every Day    Packs/day: 1.00    Years: 10.00    Pack years: 10.00    Types: Cigarettes   Smokeless tobacco: Never  Vaping Use   Vaping Use: Never used  Substance Use Topics   Alcohol use: Yes    Comment: has a 6-pack of beer 4-5 times per week   Drug use: Yes    Frequency: 7.0 times per week    Types: Marijuana     Allergies   Patient has no known allergies.   Review of Systems Review of Systems   Physical Exam Triage Vital Signs ED Triage Vitals  Enc Vitals Group     BP 10/12/21 1014 (!) 163/107     Pulse Rate 10/12/21 1014 72     Resp 10/12/21 1014 16     Temp 10/12/21 1014 98 F (36.7 C)     Temp Source 10/12/21 1014 Oral     SpO2 10/12/21 1014 100 %     Weight --      Height --      Head Circumference --      Peak Flow --      Pain Score 10/12/21 1013 8     Pain Loc --  Pain Edu? --      Excl. in GC? --    No data found.  Updated Vital Signs BP (!) 163/107 (BP Location: Left Arm)   Pulse 72   Temp 98 F (36.7 C) (Oral)   Resp 16   SpO2 100%   Visual Acuity Right Eye Distance:   Left Eye Distance:   Bilateral Distance:    Right Eye Near:   Left Eye Near:    Bilateral Near:     Physical Exam Constitutional:      Appearance: Normal appearance.  Eyes:     Extraocular Movements: Extraocular movements intact.  Pulmonary:     Effort: Pulmonary effort is normal.  Skin:    Comments: Defer to photo  Neurological:     Mental Status: He is alert and oriented to person, place, and time. Mental status is at baseline.  Psychiatric:        Mood and Affect: Mood normal.        Behavior: Behavior normal.      UC Treatments / Results  Labs (all labs ordered are listed, but only abnormal results are displayed) Labs Reviewed - No data to display  EKG   Radiology No results found.  Procedures Procedures (including critical care time)  Medications  Ordered in UC Medications - No data to display  Initial Impression / Assessment and Plan / UC Course  I have reviewed the triage vital signs and the nursing notes.  Pertinent labs & imaging results that were available during my care of the patient were reviewed by me and considered in my medical decision making (see chart for details).  Burn of left arm, second-degree, initial encounter  Skin is intact with yellow eschar covering the wound bed, erythema surrounding the wound bed and site is tender to palpation, doxycycline 7-day course prescribed to cover for infection and Silvadene prescribed for daily use after cleansing with diluted soapy water and patting dry, advised patient to keep area covered with a nonadherent dressing, given walking referral to wound care for reevaluation in 1 week, may use over-the-counter Tylenol and ibuprofen for management of pain, may follow-up with urgent care as needed Final Clinical Impressions(s) / UC Diagnoses   Final diagnoses:  Burn of left arm, second degree, initial encounter     Discharge Instructions      Schedule appointment with the wound care center in 1 week for reevaluation of your burn  Take doxycycline twice daily for the next 7 days to cover for infection  Cleanse wound daily with diluted soapy water, pat dry and apply Silvadene cream with a nonadherent dressing to help promote healing  For pain after wound is covered you may apply ice or heat to the affected area 10 to 15-minute intervals  You may take Tylenol or ibuprofen as needed for additional pain management   ED Prescriptions     Medication Sig Dispense Auth. Provider   doxycycline (VIBRAMYCIN) 100 MG capsule Take 1 capsule (100 mg total) by mouth 2 (two) times daily. 14 capsule Slade Pierpoint R, NP   silver sulfADIAZINE (SILVADENE) 1 % cream Apply 1 application. topically daily. 50 g Valinda Hoar, NP      PDMP not reviewed this encounter.   Valinda Hoar,  NP 10/12/21 [phone]

## 2021-10-12 NOTE — ED Triage Notes (Signed)
Pt presents with a burn to his left forearm x 1 week. He burned himself grilling and has tried OTC treatment.

## 2021-10-12 NOTE — Discharge Instructions (Addendum)
Schedule appointment with the wound care center in 1 week for reevaluation of your burn  Take doxycycline twice daily for the next 7 days to cover for infection  Cleanse wound daily with diluted soapy water, pat dry and apply Silvadene cream with a nonadherent dressing to help promote healing  For pain after wound is covered you may apply ice or heat to the affected area 10 to 15-minute intervals  You may take Tylenol or ibuprofen as needed for additional pain management

## 2022-04-24 ENCOUNTER — Encounter: Payer: Self-pay | Admitting: Oncology

## 2022-05-01 ENCOUNTER — Ambulatory Visit: Payer: BC Managed Care – PPO | Admitting: Primary Care

## 2022-05-09 ENCOUNTER — Ambulatory Visit: Payer: BC Managed Care – PPO | Admitting: Primary Care

## 2022-05-22 ENCOUNTER — Ambulatory Visit (INDEPENDENT_AMBULATORY_CARE_PROVIDER_SITE_OTHER): Payer: BC Managed Care – PPO | Admitting: Primary Care

## 2022-05-22 ENCOUNTER — Encounter: Payer: Self-pay | Admitting: Primary Care

## 2022-05-22 ENCOUNTER — Encounter: Payer: Self-pay | Admitting: Oncology

## 2022-05-22 ENCOUNTER — Ambulatory Visit (INDEPENDENT_AMBULATORY_CARE_PROVIDER_SITE_OTHER)
Admission: RE | Admit: 2022-05-22 | Discharge: 2022-05-22 | Disposition: A | Discharge status: Home / Self Care | Payer: BC Managed Care – PPO | Source: Ambulatory Visit | Attending: Primary Care | Admitting: Primary Care

## 2022-05-22 VITALS — BP 128/88 | HR 83 | Temp 97.3°F | Ht 70.25 in | Wt 189.0 lb

## 2022-05-22 DIAGNOSIS — M79605 Pain in left leg: Secondary | ICD-10-CM

## 2022-05-22 DIAGNOSIS — M79604 Pain in right leg: Secondary | ICD-10-CM

## 2022-05-22 DIAGNOSIS — R29898 Other symptoms and signs involving the musculoskeletal system: Secondary | ICD-10-CM | POA: Diagnosis not present

## 2022-05-22 DIAGNOSIS — G8929 Other chronic pain: Secondary | ICD-10-CM | POA: Diagnosis not present

## 2022-05-22 DIAGNOSIS — M25552 Pain in left hip: Secondary | ICD-10-CM

## 2022-05-22 DIAGNOSIS — D751 Secondary polycythemia: Secondary | ICD-10-CM

## 2022-05-22 NOTE — Progress Notes (Signed)
Subjective:    Patient ID: Dylan Paul, male    DOB: 22-Jan-1986, 36 y.o.   MRN: [id number]  HPI  Dylan Paul is a very pleasant 36 y.o. male with a history of lower extremity weakness, pain to lower extremities who presents today to discuss ongoing lower extremity pain/weakness/paresthesias.   Evaluated by me in August 2021 for a 4-6 week history of lower extremity pain and numbness to the calves with ankle edema. Prior to this visit he was evaluated at Urgent Care and was referred to vascular services, but never evaluated. Xray of the lumbar spine with mild DDD, otherwise negative.   During our visit in August 2021 ABI's and labs were ordered which were negative. He was also referred to Neurology for further evaluation.   Evaluated by neurology in October 2021, underwent MRI brain and spinal cord which did not suggest MS. Symptoms were suspected to be secondary to fibromyalgia so he was referred to PT and prescribed gabapentin.   Last neurology visit was in May 2022 for follow up of numbness/tingling, lower extremity weakness, falls, abnormal gait. EMG was negative. Gabapentin was ineffective so diclofenac 75 mg was prescribed.   Today he mentions his symptoms are improved, but he continues to notice lower extremity pain to various locations, mostly left lower extremity. Mostly left hip. His calf muscles and toes will cramp daily. He's also noticed a decrease in flexibility. He feels like his leg muscles have decreased in strength.   He evaluated by orthopedics in March 2023, underwent lab work which was negative for rheumatoid arthritis.   He denies back pain, but he does have chronic left hip pain that began with his other lower extremity symptoms.   No longer following with hematology for polycythemia. He drinks beer, almost everyday, drinks about 6 12 ounces cans at a time. He questions a thiamine deficiency.  Review of Systems  Musculoskeletal:  Positive for  arthralgias.  Neurological:  Positive for weakness and numbness.         Past Medical History:  Diagnosis Date   GERD (gastroesophageal reflux disease)     Social History   Socioeconomic History   Marital status: Legally Separated    Spouse name: Not on file   Number of children: Not on file   Years of education: Not on file   Highest education level: Not on file  Occupational History   Not on file  Tobacco Use   Smoking status: Every Day    Packs/day: 1.00    Years: 10.00    Total pack years: 10.00    Types: Cigarettes   Smokeless tobacco: Never  Vaping Use   Vaping Use: Never used  Substance and Sexual Activity   Alcohol use: Yes    Comment: has a 6-pack of beer 4-5 times per week   Drug use: Yes    Frequency: 7.0 times per week    Types: Marijuana   Sexual activity: Yes  Other Topics Concern   Not on file  Social History Narrative   ** Merged History Encounter **       Married. Wife pregnant. Works at [employer]. Enjoys playing video games.    Social Determinants of Health   Financial Resource Strain: Not on file  Food Insecurity: Not on file  Transportation Needs: Not on file  Physical Activity: Not on file  Stress: Not on file  Social Connections: Not on file  Intimate Partner Violence: Not on file  Past Surgical History:  Procedure Laterality Date   TONSILLECTOMY     WISDOM TOOTH EXTRACTION      Family History  Problem Relation Age of Onset   Hypertension Mother    Lupus Father        Skin   Heart disease Maternal Grandmother    Skin cancer Paternal Grandmother     No Known Allergies  Current Outpatient Medications on File Prior to Visit  Medication Sig Dispense Refill   ibuprofen (ADVIL) 600 MG tablet Take 1 tablet (600 mg total) by mouth every 6 (six) hours as needed. 30 tablet 0   No current facility-administered medications on file prior to visit.    BP 128/88 (BP Location: Left Arm, Patient Position:  Sitting, Cuff Size: Normal)   Pulse 83   Temp (!) 97.3 F (36.3 C) (Temporal)   Ht 5' 10.25" (1.784 m)   Wt 189 lb (85.7 kg)   SpO2 98%   BMI 26.93 kg/m  Objective:   Physical Exam Musculoskeletal:     Left hip: Decreased range of motion.     Comments: Ambulates well without assistance. No altered gait noted. Bilateral lower extremities with 5/5 strength.   Slight decrease in ROM to left hip with flexion.            Assessment & Plan:   Problem List Items Addressed This Visit       Other   Weakness of both lower extremities - Primary    Continued. Unclear etiology. Need to evaluate for MSK disorder.  Reviewed neurology notes from October 2021 through May 2022 through [facility]. Labs from orthopedics reviewed from [facility], negative for RA.  Referral placed for neurology to evaluate for MSK disorder. Labs pending today for Thiamine, B12, CBC.  Checking left hip xray.       Relevant Orders   Vitamin B12   Vitamin B1   Folate   Ambulatory referral to Neurology   DG Hip Unilat W OR W/O Pelvis 2-3 Views Left   Pain in both lower extremities   Relevant Orders   Vitamin B12   Vitamin B1   Folate   Ambulatory referral to Neurology   Polycythemia, secondary    Repeat CBC pending. Consider resuming phlebotomy.       Relevant Orders   CBC with Differential/Platelet   Vitamin B1   Folate   Chronic left hip pain    Checking left hip xray today. No alarm signs on exam.      Relevant Orders   DG Hip Unilat W OR W/O Pelvis 2-3 Views Left       Doreene Nest, NP

## 2022-05-22 NOTE — Assessment & Plan Note (Signed)
Continued. Unclear etiology. Need to evaluate for MSK disorder.  Reviewed neurology notes from October 2021 through May 2022 through [facility]. Labs from orthopedics reviewed from [facility], negative for RA.  Referral placed for neurology to evaluate for MSK disorder. Labs pending today for Thiamine, B12, CBC.  Checking left hip xray.

## 2022-05-22 NOTE — Patient Instructions (Signed)
Stop by the lab and xray prior to leaving today. I will notify you of your results once received.   You will either be contacted via phone regarding your referral to neurology, or you may receive a letter on your MyChart portal from our referral team with instructions for scheduling an appointment. Please let us know if you have not been contacted by anyone within two weeks.  It was a pleasure to see you today!

## 2022-05-22 NOTE — Assessment & Plan Note (Signed)
Checking left hip xray today. No alarm signs on exam.

## 2022-05-22 NOTE — Assessment & Plan Note (Signed)
Repeat CBC pending. Consider resuming phlebotomy.

## 2022-05-23 ENCOUNTER — Telehealth: Payer: Self-pay | Admitting: Primary Care

## 2022-05-23 ENCOUNTER — Encounter: Payer: Self-pay | Admitting: Neurology

## 2022-05-23 ENCOUNTER — Other Ambulatory Visit: Payer: Self-pay | Admitting: Primary Care

## 2022-05-23 ENCOUNTER — Telehealth: Payer: Self-pay

## 2022-05-23 DIAGNOSIS — D751 Secondary polycythemia: Secondary | ICD-10-CM

## 2022-05-23 LAB — CBC WITH DIFFERENTIAL/PLATELET
Basophils Absolute: 0.1 10*3/uL (ref 0.0–0.1)
Basophils Relative: 0.5 % (ref 0.0–3.0)
Eosinophils Absolute: 0.2 10*3/uL (ref 0.0–0.7)
Eosinophils Relative: 2 % (ref 0.0–5.0)
HCT: 54 % — ABNORMAL HIGH (ref 39.0–52.0)
Hemoglobin: 18.4 g/dL (ref 13.0–17.0)
Lymphocytes Relative: 16.8 % (ref 12.0–46.0)
Lymphs Abs: 1.8 10*3/uL (ref 0.7–4.0)
MCHC: 34.1 g/dL (ref 30.0–36.0)
MCV: 105.5 fl — ABNORMAL HIGH (ref 78.0–100.0)
Monocytes Absolute: 0.9 10*3/uL (ref 0.1–1.0)
Monocytes Relative: 8 % (ref 3.0–12.0)
Neutro Abs: 8 10*3/uL — ABNORMAL HIGH (ref 1.4–7.7)
Neutrophils Relative %: 72.7 % (ref 43.0–77.0)
Platelets: 283 10*3/uL (ref 150.0–400.0)
RBC: 5.12 Mil/uL (ref 4.22–5.81)
RDW: 14.5 % (ref 11.5–15.5)
WBC: 10.9 10*3/uL — ABNORMAL HIGH (ref 4.0–10.5)

## 2022-05-23 LAB — FOLATE: Folate: 2.9 ng/mL — ABNORMAL LOW (ref >5.9)

## 2022-05-23 LAB — VITAMIN B12: Vitamin B-12: 282 pg/mL (ref 211–911)

## 2022-05-23 NOTE — Telephone Encounter (Signed)
Spoke with patient about lab results.

## 2022-05-23 NOTE — Telephone Encounter (Signed)
Clydie Braun with [facility] lab called critical report on Hgb 18.4. sending note to Allayne Gitelman NP, [facility] and will teams [place] CMA. Recorded in lab book also.

## 2022-05-23 NOTE — Telephone Encounter (Signed)
Patient returned call regarding his lab results,would like a return call back

## 2022-05-23 NOTE — Telephone Encounter (Signed)
Noted.  See result note.  

## 2022-05-25 LAB — VITAMIN B1: Vitamin B1 (Thiamine): <6 nmol/L — ABNORMAL LOW (ref 8–30)

## 2022-06-05 ENCOUNTER — Encounter: Payer: Self-pay | Admitting: Oncology

## 2022-06-05 ENCOUNTER — Inpatient Hospital Stay: Payer: BC Managed Care – PPO | Attending: Oncology | Admitting: Oncology

## 2022-06-05 ENCOUNTER — Inpatient Hospital Stay: Payer: BC Managed Care – PPO

## 2022-06-05 VITALS — BP 162/100 | HR 88 | Temp 98.6°F | Resp 18 | Ht 71.2 in | Wt 191.2 lb

## 2022-06-05 DIAGNOSIS — D751 Secondary polycythemia: Secondary | ICD-10-CM

## 2022-06-05 DIAGNOSIS — F17210 Nicotine dependence, cigarettes, uncomplicated: Secondary | ICD-10-CM | POA: Insufficient documentation

## 2022-06-05 LAB — FERRITIN: Ferritin: 52 ng/mL (ref 24–336)

## 2022-06-05 LAB — CBC
HCT: 51.1 % (ref 39.0–52.0)
Hemoglobin: 17.8 g/dL — ABNORMAL HIGH (ref 13.0–17.0)
MCH: 34.8 pg — ABNORMAL HIGH (ref 26.0–34.0)
MCHC: 34.8 g/dL (ref 30.0–36.0)
MCV: 99.8 fL (ref 80.0–100.0)
Platelets: 234 10*3/uL (ref 150–400)
RBC: 5.12 MIL/uL (ref 4.22–5.81)
RDW: 12.6 % (ref 11.5–15.5)
WBC: 12 10*3/uL — ABNORMAL HIGH (ref 4.0–10.5)
nRBC: 0 % (ref 0.0–0.2)

## 2022-06-05 LAB — IRON AND TIBC
Iron: 289 ug/dL — ABNORMAL HIGH (ref 45–182)
Saturation Ratios: 74 % — ABNORMAL HIGH (ref 17.9–39.5)
TIBC: 391 ug/dL (ref 250–450)
UIBC: 102 ug/dL

## 2022-06-05 NOTE — Progress Notes (Signed)
Pt was seen here years ago. He had Therapeutic phlebotomy. He has not had one in 2 years. Otherwise he seems to be in good health.Pt has polycythemia.He does admit to drinking and smoking. No testosterone.

## 2022-06-05 NOTE — Progress Notes (Signed)
[facility]  [phone] [phone]) [phone]  ID: Dylan Paul OB: 1985/09/10  MR#: [id number]  YQI#:[id number]  Patient Care Team: Pleas Koch, NP as PCP - General (Internal Medicine) Pleas Koch, NP (Internal Medicine)  CHIEF COMPLAINT: Polycythemia, secondary  INTERVAL HISTORY: Patient is a 37 year old male with a longstanding history of tobacco abuse who was referred back to clinic for secondary polycythemia.  He last received phlebotomy greater than 2 years ago.  He currently feels well and is asymptomatic.  He has no neurologic complaints.  He denies any recent fevers or illnesses.  He has a good appetite and denies weight loss.  He has no chest pain, shortness of breath, cough, or hemoptysis.  He denies any nausea, vomiting, constipation, or diarrhea.  He has no urinary complaints.  Patient feels at his baseline and offers no specific complaints today.  REVIEW OF SYSTEMS:   Review of Systems  Constitutional: Negative.  Negative for fever, malaise/fatigue and weight loss.  Respiratory: Negative.  Negative for cough, hemoptysis and shortness of breath.   Cardiovascular: Negative.  Negative for chest pain and leg swelling.  Gastrointestinal: Negative.  Negative for abdominal pain.  Genitourinary: Negative.  Negative for dysuria.  Musculoskeletal: Negative.  Negative for back pain.  Skin: Negative.  Negative for rash.  Neurological: Negative.  Negative for dizziness, focal weakness, weakness and headaches.  Psychiatric/Behavioral: Negative.  The patient is not nervous/anxious.     As per HPI. Otherwise, a complete review of systems is negative.  PAST MEDICAL HISTORY: Past Medical History:  Diagnosis Date   GERD (gastroesophageal reflux disease)     PAST SURGICAL HISTORY: Past Surgical History:  Procedure Laterality Date   TONSILLECTOMY     WISDOM TOOTH EXTRACTION      FAMILY HISTORY: Family History  Problem Relation  Age of Onset   Hypertension Mother    Lupus Father        Skin   Heart disease Maternal Grandmother    Skin cancer Paternal Grandmother     ADVANCED DIRECTIVES (Y/N):  N  HEALTH MAINTENANCE: Social History   Tobacco Use   Smoking status: Every Day    Packs/day: 1.00    Years: 10.00    Total pack years: 10.00    Types: Cigarettes   Smokeless tobacco: Never  Vaping Use   Vaping Use: Never used  Substance Use Topics   Alcohol use: Yes    Comment: has a 6-pack of beer 4-5 times per week   Drug use: Yes    Frequency: 7.0 times per week    Types: Marijuana     Colonoscopy:  PAP:  Bone density:  Lipid panel:  No Known Allergies  Current Outpatient Medications  Medication Sig Dispense Refill   b complex vitamins capsule Take 1 capsule by mouth daily.     ibuprofen (ADVIL) 600 MG tablet Take 1 tablet (600 mg total) by mouth every 6 (six) hours as needed. 30 tablet 0   No current facility-administered medications for this visit.    OBJECTIVE: Vitals:   06/05/22 1500  BP: (!) 162/100  Pulse: 88  Resp: 18  Temp: 98.6 F (37 C)  SpO2: 98%     Body mass index is 26.52 kg/m.    ECOG FS:0 - Asymptomatic  General: Well-developed, well-nourished, no acute distress. Eyes: Pink conjunctiva, anicteric sclera. HEENT: Normocephalic, moist mucous membranes. Lungs: No audible wheezing or coughing. Heart: Regular rate and rhythm. Abdomen: Soft, nontender, no obvious distention. Musculoskeletal:  No edema, cyanosis, or clubbing. Neuro: Alert, answering all questions appropriately. Cranial nerves grossly intact. Skin: No rashes or petechiae noted. Psych: Normal affect. Lymphatics: No cervical, calvicular, axillary or inguinal LAD.   LAB RESULTS:  Lab Results  Component Value Date   NA 138 03/08/2020   K 3.7 03/08/2020   CL 106 03/08/2020   CO2 22 03/08/2020   GLUCOSE 107 (H) 03/08/2020   BUN <5 (L) 03/08/2020   CREATININE 0.77 03/08/2020   CALCIUM 8.7 (L)  03/08/2020   PROT 6.7 03/08/2020   ALBUMIN 3.6 03/08/2020   AST 37 03/08/2020   ALT 31 03/08/2020   ALKPHOS 76 03/08/2020   BILITOT 1.6 (H) 03/08/2020   GFRNONAA >60 03/08/2020    Lab Results  Component Value Date   WBC 10.9 (H) 05/22/2022   NEUTROABS 8.0 (H) 05/22/2022   HGB 18.4 Repeated and verified X2. (HH) 05/22/2022   HCT 54.0 (H) 05/22/2022   MCV 105.5 (H) 05/22/2022   PLT 283.0 05/22/2022     STUDIES: DG Hip Unilat W OR W/O Pelvis 2-3 Views Left  Result Date: 05/23/2022 CLINICAL DATA:  Chronic left hip pain EXAM: DG HIP (WITH OR WITHOUT PELVIS) 2-3V LEFT COMPARISON:  None Available. FINDINGS: No evidence of fracture or malalignment. No lytic or blastic osseous lesion. Mild bilateral hip joint degenerative osteoarthritis is present. IMPRESSION: 1. Mild left hip degenerative osteoarthritis. 2. No fracture, malalignment or osseous lesion. Electronically Signed   By: Malachy Moan M.D.   On: 05/23/2022 12:01    ASSESSMENT: Polycythemia, secondary.  PLAN:    Polycythemia, secondary: Likely due to continued tobacco use.  Patient previously received phlebotomy, but none for greater than 2 years.  Repeat laboratory work including iron stores and carbon monoxide levels are pending at time of dictation.  Return to clinic later this week for 500 mL phlebotomy.  Patient will then return to clinic in 1 and 2 months for laboratory work and phlebotomy if his hematocrit is less than 50%.  He would then return to clinic in 3 months with repeat laboratory work, further evaluation, and continuation of treatment if needed. Smoking cessation: Patient has no desire to quit at this time. Macrocytosis: Previously, B12 and folate were within normal limits.  Possibly related to alcohol use.  Monitor.  I spent a total of 45 minutes reviewing chart data, face-to-face evaluation with the patient, counseling and coordination of care as detailed above.   Patient expressed understanding and was in  agreement with this plan. He also understands that He can call clinic at any time with any questions, concerns, or complaints.    Jeralyn Ruths, MD   06/05/2022 3:27 PM

## 2022-06-06 ENCOUNTER — Other Ambulatory Visit: Payer: Self-pay | Admitting: Oncology

## 2022-06-06 DIAGNOSIS — D751 Secondary polycythemia: Secondary | ICD-10-CM

## 2022-06-07 ENCOUNTER — Inpatient Hospital Stay: Payer: BC Managed Care – PPO

## 2022-06-07 VITALS — BP 141/94 | HR 93 | Temp 98.2°F | Resp 18

## 2022-06-07 DIAGNOSIS — D751 Secondary polycythemia: Secondary | ICD-10-CM | POA: Diagnosis not present

## 2022-06-07 DIAGNOSIS — F17210 Nicotine dependence, cigarettes, uncomplicated: Secondary | ICD-10-CM | POA: Diagnosis not present

## 2022-06-07 LAB — CARBON MONOXIDE, BLOOD (PERFORMED AT REF LAB): Carbon Monoxide, Blood: 6.3 % — ABNORMAL HIGH (ref 0.0–3.6)

## 2022-07-07 ENCOUNTER — Encounter: Payer: Self-pay | Admitting: Oncology

## 2022-07-09 ENCOUNTER — Other Ambulatory Visit: Payer: Self-pay | Admitting: *Deleted

## 2022-07-09 ENCOUNTER — Inpatient Hospital Stay: Payer: BC Managed Care – PPO

## 2022-07-09 DIAGNOSIS — D751 Secondary polycythemia: Secondary | ICD-10-CM

## 2022-07-10 ENCOUNTER — Ambulatory Visit (INDEPENDENT_AMBULATORY_CARE_PROVIDER_SITE_OTHER): Payer: BC Managed Care – PPO | Admitting: Neurology

## 2022-07-10 ENCOUNTER — Encounter: Payer: Self-pay | Admitting: Neurology

## 2022-07-10 VITALS — BP 143/97 | HR 82 | Ht 71.2 in | Wt 189.0 lb

## 2022-07-10 DIAGNOSIS — R29898 Other symptoms and signs involving the musculoskeletal system: Secondary | ICD-10-CM | POA: Diagnosis not present

## 2022-07-10 DIAGNOSIS — M2550 Pain in unspecified joint: Secondary | ICD-10-CM

## 2022-07-10 NOTE — Progress Notes (Signed)
[facility] Clinic Note - Initial Visit   Date: 07/10/2022   Dylan Paul MRN: [id number] DOB: 28-Sep-1985   Dear [name], NP:  Thank you for your kind referral of Dylan Paul for consultation of leg weakness. Although his history is well known to you, please allow [name] to reiterate it for the purpose of our medical record. The patient was accompanied to the clinic by mother who also provides collateral information.     Dylan Paul is a 37 y.o. right-handed male with tobacco use, secondary polycythemia presenting for evaluation of bilateral leg pain.   IMPRESSION/PLAN: Subjective bilateral leg weakness.  His neuromuscular exam is entirely normal.  No evidence of weakness or neuropathy.  Prior testing was reviewed and did not disclose primary neurological etiology for symptoms.  He complains more of polyarthralgia and it's possible he may have some pain-limited weakness.  There is no indication for repeat NCS/EMG.  He does have thiamine and folate deficiency as well as low B12 which can contribute to generalized malaise.  He is taking supplementation.  I advised him to abstain from alcohol to prevent onset of neuropathy or other unwanted alcohol-related effects.   Return to clinic as needed  ------------------------------------------------------------- History of present illness: Starting around 2021, he began having pain numbness/tingling from his waist down, which resolved around 2023.  He saw Dr. Melrose Nakayama at [facility] who performed extensive evaluation including MRI brain, MRI thoracic spine, MRI lumbar spine, and NCS/EMG of the legs which was unremarkable. He no longer has numbness/tingling in the feet.  He has a lot throbbing pain in the knees, ankles, and hips.  He also has weakness in the legs.  He walks independently.  He has fallen several times over the the past year.   He smokes 1PPD.  He drinks 6-pack beer about  5 times per week. He works in a lab.  He lives alone.  He recent labs show folate and thiamine deficiency.  His vitamin B12 level is also low.  He is taking supplementation which has helped make him feel better.    Out-side paper records, electronic medical record, and images have been reviewed where available and summarized as:  Component     Latest Ref Rng 05/22/2022  Vitamin B12     211 - 911 pg/mL 282   Vitamin B1 (Thiamine)     8 - 30 nmol/L <6 (L)   Folate     >5.9 ng/mL 2.9 (L)     Legend: (L) Low  07/19/20 EMG LOWER LEGS:  Normal study. There is no electrodiagnostic evidence of a large fiber neuropathy in the legs.  MRI BRAIN WITHOUT CONTRAST 03/18/2020 1. No acute intracranial abnormality. 2. Focus of increased T2 signal within the white matter the right frontal operculum, nonspecific. Differential diagnosis include demyelinating disease, migraine headache, vasculitis or other infectious/inflammatory process. 3. Paranasal sinus disease, most pronounced in the left maxillary sinus.  MRI THORACIC SPINE WITHOUT CONTRAST 03/08/20 Multilevel degenerative change in the mid and lower thoracic spine as above. Multiple disc protrusions most prominent at T6-7 with cord flattening but no significant spinal stenosis.  Mild anterior wedging T7, T8, T9 vertebral bodies with associated endplate irregularity and Schmorl's nodes. 26 degrees of kyphosis. Findings consistent with Scheuermann's disease.  02/02/2020 MRI L SPINE IMPRESSION: 1. Small right subarticular disc protrusion at L5-S1 contacting the descending right S1 nerve root. 2. Small left foraminal disc protrusion at L3-L4 without evidence of neural impingement. 3. No significant  canal or foraminal stenosis at any level.  01/27/2020 [name] ABI:  Normal  Lab Results  Component Value Date   HGBA1C 5.1 01/22/2020   Lab Results  Component Value Date   VITAMINB12 282 05/22/2022   Lab Results  Component Value Date   TSH 2.98  01/22/2020   No results found for: "ESRSEDRATE", "POCTSEDRATE"  Past Medical History:  Diagnosis Date   GERD (gastroesophageal reflux disease)     Past Surgical History:  Procedure Laterality Date   TONSILLECTOMY     WISDOM TOOTH EXTRACTION       Medications:  Outpatient Encounter Medications as of 07/10/2022  Medication Sig   b complex vitamins capsule Take 1 capsule by mouth daily.   ibuprofen (ADVIL) 600 MG tablet Take 1 tablet (600 mg total) by mouth every 6 (six) hours as needed.   No facility-administered encounter medications on file as of 07/10/2022.    Allergies: No Known Allergies  Family History: Family History  Problem Relation Age of Onset   Hypertension Mother    Lupus Father        Skin   Heart disease Maternal Grandmother    Skin cancer Paternal Grandmother     Social History: Social History   Tobacco Use   Smoking status: Every Day    Packs/day: 1.00    Years: 10.00    Total pack years: 10.00    Types: Cigarettes   Smokeless tobacco: Never  Vaping Use   Vaping Use: Never used  Substance Use Topics   Alcohol use: Yes    Comment: has a 6-pack of beer 4-5 times per week   Drug use: Yes    Frequency: 7.0 times per week    Types: Marijuana   Social History   Social History Narrative   ** Merged History Encounter ** Married.   Wife pregnant.   Works at [employer].   Enjoys playing video games.          Right Handed   Lives in a one story home    Vital Signs:  BP (!) 143/97   Pulse 82   Ht 5' 11.2" (1.808 m)   Wt 189 lb (85.7 kg)   SpO2 98%   BMI 26.21 kg/m   Neurological Exam: MENTAL STATUS including orientation to time, place, person, recent and remote memory, attention span and concentration, language, and fund of knowledge is normal.  Speech is not dysarthric.  CRANIAL NERVES: II:  No visual field defects.     III-IV-VI: Pupils equal round and reactive to light.  Normal conjugate, extra-ocular eye  movements in all directions of gaze.  No nystagmus.  No ptosis.   V:  Normal facial sensation.    VII:  Normal facial symmetry and movements.   VIII:  Normal hearing and vestibular function.   IX-X:  Normal palatal movement.   XI:  Normal shoulder shrug and head rotation.   XII:  Normal tongue strength and range of motion, no deviation or fasciculation.  MOTOR:  Motor strength is 5/5 throughout, including distally. No atrophy, fasciculations or abnormal movements.  No pronator drift.   MSRs:                                           Right        Left brachioradialis 2+  2+  biceps 2+  2+  triceps  2+  2+  patellar 2+  2+  ankle jerk 2+  2+  Hoffman no  no  plantar response down  down   SENSORY:  Normal and symmetric perception of light touch, pinprick, vibration, and temperature.  Romberg's sign absent.   COORDINATION/GAIT: Normal finger-to- nose-finger.  Intact rapid alternating movements bilaterally. Gait narrow based and stable. Tandem and stressed gait intact.     Thank you for allowing me to participate in patient's care.  If I can answer any additional questions, I would be pleased to do so.    Sincerely,    Jacilyn Sanpedro K. Posey Pronto, DO

## 2022-07-16 ENCOUNTER — Inpatient Hospital Stay: Payer: BC Managed Care – PPO

## 2022-07-16 ENCOUNTER — Inpatient Hospital Stay: Payer: BC Managed Care – PPO | Attending: Oncology

## 2022-07-23 ENCOUNTER — Inpatient Hospital Stay: Payer: BC Managed Care – PPO

## 2022-08-07 ENCOUNTER — Inpatient Hospital Stay: Payer: BC Managed Care – PPO

## 2022-08-09 ENCOUNTER — Inpatient Hospital Stay: Payer: BC Managed Care – PPO | Attending: Oncology

## 2022-08-09 ENCOUNTER — Inpatient Hospital Stay: Payer: BC Managed Care – PPO

## 2022-08-09 DIAGNOSIS — F17210 Nicotine dependence, cigarettes, uncomplicated: Secondary | ICD-10-CM | POA: Diagnosis not present

## 2022-08-09 DIAGNOSIS — D751 Secondary polycythemia: Secondary | ICD-10-CM | POA: Diagnosis not present

## 2022-08-09 LAB — CBC WITH DIFFERENTIAL/PLATELET
Abs Immature Granulocytes: 0.04 10*3/uL (ref 0.00–0.07)
Basophils Absolute: 0.1 10*3/uL (ref 0.0–0.1)
Basophils Relative: 1 %
Eosinophils Absolute: 0.3 10*3/uL (ref 0.0–0.5)
Eosinophils Relative: 2 %
HCT: 51.3 % (ref 39.0–52.0)
Hemoglobin: 17.8 g/dL — ABNORMAL HIGH (ref 13.0–17.0)
Immature Granulocytes: 0 %
Lymphocytes Relative: 20 %
Lymphs Abs: 2.3 10*3/uL (ref 0.7–4.0)
MCH: 33.1 pg (ref 26.0–34.0)
MCHC: 34.7 g/dL (ref 30.0–36.0)
MCV: 95.5 fL (ref 80.0–100.0)
Monocytes Absolute: 0.9 10*3/uL (ref 0.1–1.0)
Monocytes Relative: 8 %
Neutro Abs: 8 10*3/uL — ABNORMAL HIGH (ref 1.7–7.7)
Neutrophils Relative %: 69 %
Platelets: 228 10*3/uL (ref 150–400)
RBC: 5.37 MIL/uL (ref 4.22–5.81)
RDW: 12.1 % (ref 11.5–15.5)
WBC: 11.7 10*3/uL — ABNORMAL HIGH (ref 4.0–10.5)
nRBC: 0 % (ref 0.0–0.2)

## 2022-08-09 LAB — FERRITIN: Ferritin: 32 ng/mL (ref 24–336)

## 2022-08-09 LAB — VITAMIN B12: Vitamin B-12: 714 pg/mL (ref 180–914)

## 2022-08-09 LAB — IRON AND TIBC
Iron: 136 ug/dL (ref 45–182)
Saturation Ratios: 32 % (ref 17.9–39.5)
TIBC: 430 ug/dL (ref 250–450)
UIBC: 294 ug/dL

## 2022-08-13 LAB — HGB FRACTIONATION CASCADE
Hgb A2: 2.9 % (ref 1.8–3.2)
Hgb A: 96.3 % — ABNORMAL LOW (ref 96.4–98.8)
Hgb F: 0.8 % (ref 0.0–2.0)
Hgb S: 0 %

## 2022-09-04 ENCOUNTER — Inpatient Hospital Stay: Payer: BC Managed Care – PPO | Attending: Oncology

## 2022-09-04 ENCOUNTER — Inpatient Hospital Stay: Payer: BC Managed Care – PPO

## 2022-09-04 ENCOUNTER — Inpatient Hospital Stay (HOSPITAL_BASED_OUTPATIENT_CLINIC_OR_DEPARTMENT_OTHER): Payer: BC Managed Care – PPO | Admitting: Oncology

## 2022-09-04 VITALS — BP 133/95 | HR 82

## 2022-09-04 VITALS — BP 153/115 | HR 77 | Temp 98.1°F | Ht 71.0 in | Wt 187.2 lb

## 2022-09-04 DIAGNOSIS — D751 Secondary polycythemia: Secondary | ICD-10-CM | POA: Diagnosis not present

## 2022-09-04 DIAGNOSIS — F17210 Nicotine dependence, cigarettes, uncomplicated: Secondary | ICD-10-CM | POA: Diagnosis not present

## 2022-09-04 LAB — CBC WITH DIFFERENTIAL/PLATELET
Abs Immature Granulocytes: 0.03 10*3/uL (ref 0.00–0.07)
Basophils Absolute: 0.1 10*3/uL (ref 0.0–0.1)
Basophils Relative: 1 %
Eosinophils Absolute: 0.1 10*3/uL (ref 0.0–0.5)
Eosinophils Relative: 1 %
HCT: 49.2 % (ref 39.0–52.0)
Hemoglobin: 17.3 g/dL — ABNORMAL HIGH (ref 13.0–17.0)
Immature Granulocytes: 0 %
Lymphocytes Relative: 20 %
Lymphs Abs: 2 10*3/uL (ref 0.7–4.0)
MCH: 32.6 pg (ref 26.0–34.0)
MCHC: 35.2 g/dL (ref 30.0–36.0)
MCV: 92.7 fL (ref 80.0–100.0)
Monocytes Absolute: 0.9 10*3/uL (ref 0.1–1.0)
Monocytes Relative: 9 %
Neutro Abs: 6.9 10*3/uL (ref 1.7–7.7)
Neutrophils Relative %: 69 %
Platelets: 228 10*3/uL (ref 150–400)
RBC: 5.31 MIL/uL (ref 4.22–5.81)
RDW: 12.5 % (ref 11.5–15.5)
WBC: 10.1 10*3/uL (ref 4.0–10.5)
nRBC: 0 % (ref 0.0–0.2)

## 2022-09-04 LAB — FERRITIN: Ferritin: 15 ng/mL — ABNORMAL LOW (ref 24–336)

## 2022-09-04 NOTE — Progress Notes (Signed)
[facility]  Telephone:([phone]) [phone] [phone]) [phone]  ID: Dylan Paul OB: 09/18/1985  MR#: [id number]  VZS#:[id number]  Patient Care Team: Doreene Nest, NP as PCP - General (Internal Medicine) Doreene Nest, NP (Internal Medicine) Glendale Chard, DO as Consulting Physician (Neurology)  CHIEF COMPLAINT: Polycythemia, secondary  INTERVAL HISTORY: Patient returns to clinic today for repeat laboratory work, further eval ration, and consideration of additional phlebotomy.  He continues to feel well and remains asymptomatic.  He has no neurologic complaints.  He denies any recent fevers or illnesses.  He has a good appetite and denies weight loss.  He has no chest pain, shortness of breath, cough, or hemoptysis.  He denies any nausea, vomiting, constipation, or diarrhea.  He has no urinary complaints.  Patient offers no specific complaints today.  REVIEW OF SYSTEMS:   Review of Systems  Constitutional: Negative.  Negative for fever, malaise/fatigue and weight loss.  Respiratory: Negative.  Negative for cough, hemoptysis and shortness of breath.   Cardiovascular: Negative.  Negative for chest pain and leg swelling.  Gastrointestinal: Negative.  Negative for abdominal pain.  Genitourinary: Negative.  Negative for dysuria.  Musculoskeletal: Negative.  Negative for back pain.  Skin: Negative.  Negative for rash.  Neurological: Negative.  Negative for dizziness, focal weakness, weakness and headaches.  Psychiatric/Behavioral: Negative.  The patient is not nervous/anxious.     As per HPI. Otherwise, a complete review of systems is negative.  PAST MEDICAL HISTORY: Past Medical History:  Diagnosis Date   GERD (gastroesophageal reflux disease)     PAST SURGICAL HISTORY: Past Surgical History:  Procedure Laterality Date   TONSILLECTOMY     WISDOM TOOTH EXTRACTION      FAMILY HISTORY: Family History  Problem Relation Age of Onset   Hypertension  Mother    Lupus Father        Skin   Heart disease Maternal Grandmother    Skin cancer Paternal Grandmother     ADVANCED DIRECTIVES (Y/N):  N  HEALTH MAINTENANCE: Social History   Tobacco Use   Smoking status: Every Day    Packs/day: 1.00    Years: 10.00    Additional pack years: 0.00    Total pack years: 10.00    Types: Cigarettes   Smokeless tobacco: Never  Vaping Use   Vaping Use: Never used  Substance Use Topics   Alcohol use: Yes    Comment: has a 6-pack of beer 4-5 times per week   Drug use: Yes    Frequency: 7.0 times per week    Types: Marijuana     Colonoscopy:  PAP:  Bone density:  Lipid panel:  No Known Allergies  Current Outpatient Medications  Medication Sig Dispense Refill   b complex vitamins capsule Take 1 capsule by mouth daily.     ibuprofen (ADVIL) 600 MG tablet Take 1 tablet (600 mg total) by mouth every 6 (six) hours as needed. 30 tablet 0   No current facility-administered medications for this visit.    OBJECTIVE: Vitals:   09/04/22 1406 09/04/22 1408  BP: (!) 152/110 (!) 153/115  Pulse: 77   Temp: 98.1 F (36.7 C)      Body mass index is 26.11 kg/m.    ECOG FS:0 - Asymptomatic  General: Well-developed, well-nourished, no acute distress. Eyes: Pink conjunctiva, anicteric sclera. HEENT: Normocephalic, moist mucous membranes. Lungs: No audible wheezing or coughing. Heart: Regular rate and rhythm. Abdomen: Soft, nontender, no obvious distention. Musculoskeletal: No edema, cyanosis,  or clubbing. Neuro: Alert, answering all questions appropriately. Cranial nerves grossly intact. Skin: No rashes or petechiae noted. Psych: Normal affect.   LAB RESULTS:  Lab Results  Component Value Date   NA 138 03/08/2020   K 3.7 03/08/2020   CL 106 03/08/2020   CO2 22 03/08/2020   GLUCOSE 107 (H) 03/08/2020   BUN <5 (L) 03/08/2020   CREATININE 0.77 03/08/2020   CALCIUM 8.7 (L) 03/08/2020   PROT 6.7 03/08/2020   ALBUMIN 3.6 03/08/2020    AST 37 03/08/2020   ALT 31 03/08/2020   ALKPHOS 76 03/08/2020   BILITOT 1.6 (H) 03/08/2020   GFRNONAA >60 03/08/2020    Lab Results  Component Value Date   WBC 10.1 09/04/2022   NEUTROABS 6.9 09/04/2022   HGB 17.3 (H) 09/04/2022   HCT 49.2 09/04/2022   MCV 92.7 09/04/2022   PLT 228 09/04/2022     STUDIES: No results found.  ASSESSMENT: Polycythemia, secondary.  PLAN:    Polycythemia, secondary: Likely due to continued tobacco use.  Patient has an elevated carbon monoxide level.  All of patient's other laboratory work including iron stores and Jak 2 mutation are either negative or within normal limits.  Although patient's hematocrit is below his goal of 50%, will proceed with 500 mL phlebotomy today.  Return to clinic in 3 months with laboratory work and consideration of phlebotomy.  Patient will then return to clinic in 6 months with repeat laboratory, further evaluation, and continuation of phlebotomy if needed.   Smoking cessation: Patient has no desire to quit at this time. Macrocytosis: Previously, B12 and folate were within normal limits.  Possibly related to alcohol use.  Monitor.  I spent a total of 20 minutes reviewing chart data, face-to-face evaluation with the patient, counseling and coordination of care as detailed above.    Patient expressed understanding and was in agreement with this plan. He also understands that He can call clinic at any time with any questions, concerns, or complaints.    Jeralyn Ruths, MD   09/04/2022 2:41 PM

## 2022-09-04 NOTE — Patient Instructions (Signed)

## 2022-10-16 ENCOUNTER — Ambulatory Visit
Admission: RE | Admit: 2022-10-16 | Discharge: 2022-10-16 | Disposition: A | Discharge status: Home / Self Care | Payer: BC Managed Care – PPO | Source: Ambulatory Visit | Attending: Urgent Care | Admitting: Urgent Care

## 2022-10-16 VITALS — BP 136/96 | HR 79 | Temp 98.8°F | Resp 18

## 2022-10-16 DIAGNOSIS — R519 Headache, unspecified: Secondary | ICD-10-CM

## 2022-10-16 MED ORDER — PROMETHAZINE HCL 25 MG PO TABS: 25.0000 mg | ORAL_TABLET | Freq: Four times a day (QID) | ORAL | 0 refills | Status: DC | PRN

## 2022-10-16 MED ORDER — KETOROLAC TROMETHAMINE 30 MG/ML IJ SOLN
30.0000 mg | Freq: Once | INTRAMUSCULAR | Status: AC
  Administered 2022-10-16: 30 mg via INTRAMUSCULAR

## 2022-10-16 MED ORDER — DEXAMETHASONE SODIUM PHOSPHATE 10 MG/ML IJ SOLN
10.0000 mg | Freq: Once | INTRAMUSCULAR | Status: AC
  Administered 2022-10-16: 10 mg via INTRAMUSCULAR

## 2022-10-16 NOTE — Discharge Instructions (Signed)
Follow up here or with your primary care provider if your symptoms are worsening or not improving.     

## 2022-10-16 NOTE — ED Triage Notes (Signed)
Patient presents to UC for HA and some light sensitivity x 5 days. Took ibuprofen yesterday.

## 2022-10-16 NOTE — ED Provider Notes (Signed)
[facility]    CSN: [id number] Arrival date & time: 10/16/22  1702      History   Chief Complaint Chief Complaint  Patient presents with   Headache    5 days - Entered by patient    HPI Neeson Susman is a 37 y.o. male.    Headache   Patient presents to urgent care with headache x 5 days.  He reports photophobia and nausea.  Denies previous history of headache.  Denies vomiting.  Denies fever.  Denies vision changes other than photophobia.  Denies URI symptoms or recent URI symptoms.  Endorses increased levels of stress but he states this is chronic and not an acute change.  No gait changes.  No balance changes.  No confusion.  Past Medical History:  Diagnosis Date   GERD (gastroesophageal reflux disease)     Patient Active Problem List   Diagnosis Date Noted   Chronic left hip pain 05/22/2022   Polycythemia, secondary 03/24/2020   Weakness of both lower extremities 01/22/2020   Pain in both lower extremities 01/22/2020   Decreased appetite 01/22/2020   Gastroesophageal reflux disease 09/19/2016    Past Surgical History:  Procedure Laterality Date   TONSILLECTOMY     WISDOM TOOTH EXTRACTION         Home Medications    Prior to Admission medications   Medication Sig Start Date End Date Taking? Authorizing Provider  b complex vitamins capsule Take 1 capsule by mouth daily.    [provider]  ibuprofen (ADVIL) 600 MG tablet Take 1 tablet (600 mg total) by mouth every 6 (six) hours as needed. 08/09/21   Mickie Bail, NP    Family History Family History  Problem Relation Age of Onset   Hypertension Mother    Lupus Father        Skin   Heart disease Maternal Grandmother    Skin cancer Paternal Grandmother     Social History Social History   Tobacco Use   Smoking status: Every Day    Packs/day: 1.00    Years: 10.00    Additional pack years: 0.00    Total pack years: 10.00    Types: Cigarettes   Smokeless tobacco:  Never  Vaping Use   Vaping Use: Never used  Substance Use Topics   Alcohol use: Yes    Comment: has a 6-pack of beer 4-5 times per week   Drug use: Yes    Frequency: 7.0 times per week    Types: Marijuana     Allergies   Patient has no known allergies.   Review of Systems Review of Systems  Neurological:  Positive for headaches.     Physical Exam Triage Vital Signs ED Triage Vitals  Enc Vitals Group     BP 10/16/22 1717 (!) 136/96     Pulse Rate 10/16/22 1717 79     Resp 10/16/22 1717 18     Temp 10/16/22 1717 98.8 F (37.1 C)     Temp Source 10/16/22 1717 Oral     SpO2 10/16/22 1717 96 %     Weight --      Height --      Head Circumference --      Peak Flow --      Pain Score 10/16/22 1719 7     Pain Loc --      Pain Edu? --      Excl. in GC? --    No data found.  Updated Vital Signs BP (!) 136/96 (BP Location: Left Arm)   Pulse 79   Temp 98.8 F (37.1 C) (Oral)   Resp 18   SpO2 96%   Visual Acuity Right Eye Distance:   Left Eye Distance:   Bilateral Distance:    Right Eye Near:   Left Eye Near:    Bilateral Near:     Physical Exam Vitals reviewed.  Constitutional:      Appearance: He is well-developed. He is not ill-appearing.  Neurological:     Mental Status: He is alert and oriented to person, place, and time.     Sensory: No sensory deficit.     Motor: No weakness.     Coordination: Romberg sign negative. Coordination normal.     Gait: Gait normal.  Psychiatric:        Mood and Affect: Mood normal. Mood is not anxious.        Behavior: Behavior normal.      UC Treatments / Results  Labs (all labs ordered are listed, but only abnormal results are displayed) Labs Reviewed - No data to display  EKG   Radiology No results found.  Procedures Procedures (including critical care time)  Medications Ordered in UC Medications - No data to display  Initial Impression / Assessment and Plan / UC Course  I have reviewed the triage  vital signs and the nursing notes.  Pertinent labs & imaging results that were available during my care of the patient were reviewed by me and considered in my medical decision making (see chart for details).   Roni Starr is a 37 y.o. male presenting with acute HA. Patient is afebrile without recent antipyretics, satting well on room air. Overall is well appearing, well hydrated, without respiratory distress. Pulmonary exam is unremarkable.  Lungs CTAB without wheezing, rhonchi, rales. RRR.  Neuroexam is grossly normal.  Eating patient for acute headache with headache cocktail of Toradol 30 mg IM and Decadron 10 mg IM to reduce risk of recurrence.  Will discharge patient with prescription for promethazine to treat nausea and promote rest.  Reviewed chart history.   Counseled patient on potential for adverse effects with medications prescribed/recommended today, ER and return-to-clinic precautions discussed, patient verbalized understanding and agreement with care plan.  Final Clinical Impressions(s) / UC Diagnoses   Final diagnoses:  None   Discharge Instructions   None    ED Prescriptions   None    PDMP not reviewed this encounter.   Charma Igo, [place] 10/16/22 1749

## 2022-12-04 ENCOUNTER — Inpatient Hospital Stay: Payer: BC Managed Care – PPO | Attending: Oncology

## 2022-12-04 ENCOUNTER — Inpatient Hospital Stay: Payer: BC Managed Care – PPO

## 2023-02-26 ENCOUNTER — Ambulatory Visit (INDEPENDENT_AMBULATORY_CARE_PROVIDER_SITE_OTHER): Payer: BC Managed Care – PPO | Admitting: Primary Care

## 2023-02-26 ENCOUNTER — Ambulatory Visit: Payer: BC Managed Care – PPO

## 2023-02-26 ENCOUNTER — Other Ambulatory Visit: Payer: Self-pay | Admitting: Primary Care

## 2023-02-26 VITALS — BP 126/80 | HR 110 | Temp 98.1°F | Ht 71.0 in | Wt 180.0 lb

## 2023-02-26 DIAGNOSIS — D751 Secondary polycythemia: Secondary | ICD-10-CM

## 2023-02-26 DIAGNOSIS — R600 Localized edema: Secondary | ICD-10-CM

## 2023-02-26 NOTE — Patient Instructions (Signed)
Stop by the lab prior to leaving today. I will notify you of your results once received.   I ordered an ultrasound for your right lower leg. You will get a phone call either today or tomorrow to get this scheduled.  Continue rest and elevation. You can try compression socks.   I will be in touch with the results of the ultrasound.   It was a pleasure to see you today!

## 2023-02-26 NOTE — Assessment & Plan Note (Addendum)
New finding.  Differentials include: DVT, lymphedema, gout, overuse. Less suspicion for cellulitis based on HPI and exam.  BMP, CBC w/ diff, iron studies, uric acid labs pending.  Vascular US of right lower extremity ordered to rule out DVT.   Discussed supportive care such as OTC treatment, rest, ice, compression socks.   Follow up pending results.   I evaluated patient, was consulted regarding treatment, and agree with assessment and plan per Tenna Delaine, RN, DNP student.   Mayra Reel, NP-C

## 2023-02-26 NOTE — Progress Notes (Signed)
Acute Office Visit  Subjective:     Patient ID: Dylan Paul, male    DOB: 1985/09/03, 37 y.o.   MRN: [id number]  Chief Complaint  Patient presents with   Leg Swelling    Right leg swelling x 3 weeks    HPI  Zaquan Duffner is a pleasant 37 y.o. 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  In the last several years he has had intermittent swelling in his bilateral lower extremities, which was usually resolved the same day with rest and elevation. Now starting 3 weeks ago, only his right lower extremity from distal to the knee to the dorsum of the foot developed edema that has never resolved. The swelling is mildly improved when he wakes, but by the end of his work, his right lower extremity is swollen. He is active at work. He moved into a townhouse 1 month ago that has stairs which he is not used to having. He is not experiencing pain but does have occasional itching. He has tried ibuprofen and elevation with minimal improvement. To his knowledge, he does not have a history of blood clots.  Review of Systems  Constitutional:  Negative for chills, fever and malaise/fatigue.  Respiratory:  Negative for cough and shortness of breath.   Cardiovascular:  Positive for leg swelling. Negative for chest pain.       Right lower extremity  Gastrointestinal:  Negative for nausea and vomiting.  Musculoskeletal:  Negative for falls.  Skin:  Positive for itching.  Neurological:  Negative for dizziness and headaches.      Objective:    BP 126/80   Pulse (!) 110   Temp 98.1 F (36.7 C) (Temporal)   Ht 5\' 11"  (1.803 m)   Wt 180 lb (81.6 kg)   SpO2 98%   BMI 25.10 kg/m   BP Readings from Last 3 Encounters:  02/26/23 126/80  10/16/22 (!) 136/96  09/04/22 (!) 133/95   Wt Readings from Last 3 Encounters:  02/26/23 180 lb (81.6 kg)  09/04/22  187 lb 3.2 oz (84.9 kg)  07/10/22 189 lb (85.7 kg)     Physical Exam Cardiovascular:     Rate and Rhythm: Regular rhythm. Tachycardia present.     Pulses: Normal pulses.  Pulmonary:     Effort: Pulmonary effort is normal.     Breath sounds: Normal breath sounds.  Skin:    General: Skin is warm and dry.     Capillary Refill: Capillary refill takes less than 2 seconds.     Comments: Edema noted to right lower extremity from distally to the knee to the dorsum of the foot   Neurological:     General: No focal deficit present.     Mental Status: He is alert.    No results found for any visits on 02/26/23.      Assessment & Plan:   Problem List Items Addressed This Visit       Other   Polycythemia, secondary   Relevant Orders   CBC with Differential   IBC + Ferritin   Edema of right lower extremity - Primary    New finding. Ongoing.  Differentials include: DVT, lymphedema, gout, overuse. Less suspicion for cellulitis based on HPI and exam.  BMP, CBC w/ diff, iron studies, uric acid labs pending.  Vascular US of right lower extremity ordered to rule out DVT.  Discussed supportive care such as OTC treatment, rest, ice, compression socks.   Follow up pending results.       Relevant Orders   Basic Metabolic Panel   Uric Acid   VAS [name] LOWER EXTREMITY VENOUS (DVT)   US Venous Img Lower Unilateral Right (DVT)    No orders of the defined types were placed in this encounter.   No follow-ups on file.  Benito Mccreedy, RN

## 2023-02-26 NOTE — Progress Notes (Signed)
Subjective:    Patient ID: Dylan Paul, male    DOB: Dec 28, 1985, 37 y.o.   MRN: [id number]  HPI  Fahad Cisse is a very pleasant 37 y.o. male with a history of polycythemia and bilateral lower extremity weakness who presents today to discuss lower extremity swelling.   His swelling is located to the right lower extremity located distally to the right knee down through the dorsal foot. This began about 3 weeks ago.   He has a history of intermittent bilateral lower extremity swelling which historically resolves with elevation and rest.   His swelling has improved when waking up for his day, but never resolves. His swelling increases gradually through his work day and is worse when coming home. He's tried elevating his legs with some improvement.   He works in an active role. About 1 month ago he moved to a town home which has several flights of stairs.    Review of Systems  Respiratory:  Negative for shortness of breath.   Cardiovascular:  Positive for leg swelling. Negative for chest pain.  Skin:  Negative for color change.         Past Medical History:  Diagnosis Date   GERD (gastroesophageal reflux disease)     Social History   Socioeconomic History   Marital status: Legally Separated    Spouse name: Not on file   Number of children: Not on file   Years of education: Not on file   Highest education level: GED or equivalent  Occupational History   Not on file  Tobacco Use   Smoking status: Every Day    Current packs/day: 1.00    Average packs/day: 1 pack/day for 10.0 years (10.0 ttl pk-yrs)    Types: Cigarettes   Smokeless tobacco: Never  Vaping Use   Vaping status: Never Used  Substance and Sexual Activity   Alcohol use: Yes    Comment: has a 6-pack of beer 4-5 times per week   Drug use: Yes    Frequency: 7.0 times per week    Types: Marijuana   Sexual activity: Yes  Other Topics Concern   Not on file  Social History Narrative   **  Merged History Encounter ** Married.   Wife pregnant.   Works at [employer].   Enjoys playing video games.          Right Handed   Lives in a one story home   Social Determinants of Health   Financial Resource Strain: Medium Risk (02/26/2023)   Overall Financial Resource Strain (CARDIA)    Difficulty of Paying Living Expenses: Somewhat hard  Food Insecurity: No Food Insecurity (02/26/2023)   Hunger Vital Sign    Worried About Running Out of Food in the Last Year: Never true    Ran Out of Food in the Last Year: Never true  Transportation Needs: No Transportation Needs (02/26/2023)   PRAPARE - Administrator, Civil Service (Medical): No    Lack of Transportation (Non-Medical): No  Physical Activity: Insufficiently Active (02/26/2023)   Exercise Vital Sign    Days of Exercise per Week: 2 days    Minutes of Exercise per Session: 20 min  Stress: No Stress Concern Present (02/26/2023)   [employer] of Occupational Health - Occupational Stress Questionnaire    Feeling of Stress : Only a little  Social Connections: Socially Isolated (02/26/2023)   Social Connection and Isolation Panel [NHANES]    Frequency of Communication with  Friends and Family: More than three times a week    Frequency of Social Gatherings with Friends and Family: Twice a week    Attends Religious Services: Never    Database administrator or Organizations: No    Attends Engineer, structural: Not on file    Marital Status: Separated  Intimate Partner Violence: Not on file    Past Surgical History:  Procedure Laterality Date   TONSILLECTOMY     WISDOM TOOTH EXTRACTION      Family History  Problem Relation Age of Onset   Hypertension Mother    Lupus Father        Skin   Heart disease Maternal Grandmother    Skin cancer Paternal Grandmother     No Known Allergies  Current Outpatient Medications on File Prior to Visit  Medication Sig Dispense Refill   b complex  vitamins capsule Take 1 capsule by mouth daily.     ibuprofen (ADVIL) 600 MG tablet Take 1 tablet (600 mg total) by mouth every 6 (six) hours as needed. 30 tablet 0   promethazine (PHENERGAN) 25 MG tablet Take 1 tablet (25 mg total) by mouth every 6 (six) hours as needed for nausea or vomiting. 20 tablet 0   No current facility-administered medications on file prior to visit.    BP 126/80   Pulse (!) 110   Temp 98.1 F (36.7 C) (Temporal)   Ht 5\' 11"  (1.803 m)   Wt 180 lb (81.6 kg)   SpO2 98%   BMI 25.10 kg/m  Objective:   Physical Exam Cardiovascular:     Rate and Rhythm: Normal rate and regular rhythm.     Comments: Moderate right lower extremity swelling distal to knee through dorsal foot. Trace pitting. Pulmonary:     Effort: Pulmonary effort is normal.     Breath sounds: Normal breath sounds.  Musculoskeletal:     Cervical back: Neck supple.  Skin:    General: Skin is warm and dry.     Findings: No erythema.  Neurological:     Mental Status: He is alert and oriented to person, place, and time.  Psychiatric:        Mood and Affect: Mood normal.           Assessment & Plan:  Edema of right lower extremity Assessment & Plan: New finding.  Differentials include: DVT, lymphedema, gout, overuse. Less suspicion for cellulitis based on HPI and exam.  BMP, CBC w/ diff, iron studies, uric acid labs pending.  Vascular US of right lower extremity ordered to rule out DVT.   Discussed supportive care such as OTC treatment, rest, ice, compression socks.   Follow up pending results.   I evaluated patient, was consulted regarding treatment, and agree with assessment and plan per Tenna Delaine, RN, DNP student.   Mayra Reel, NP-C   Orders: -     Basic metabolic panel -     Uric acid -     US Venous Img Lower Unilateral Right (DVT); Future  Polycythemia, secondary -     CBC with Differential/Platelet -     IBC + Ferritin        Doreene Nest,  NP

## 2023-02-27 ENCOUNTER — Ambulatory Visit (INDEPENDENT_AMBULATORY_CARE_PROVIDER_SITE_OTHER): Payer: BC Managed Care – PPO

## 2023-02-27 ENCOUNTER — Other Ambulatory Visit: Payer: Self-pay | Admitting: Primary Care

## 2023-02-27 DIAGNOSIS — R7989 Other specified abnormal findings of blood chemistry: Secondary | ICD-10-CM | POA: Diagnosis not present

## 2023-02-27 LAB — CBC WITH DIFFERENTIAL/PLATELET
Basophils Absolute: 0.1 10*3/uL (ref 0.0–0.1)
Basophils Relative: 1 % (ref 0.0–3.0)
Eosinophils Absolute: 0.4 10*3/uL (ref 0.0–0.7)
Eosinophils Relative: 3.9 % (ref 0.0–5.0)
HCT: 47.6 % (ref 39.0–52.0)
Hemoglobin: 15.5 g/dL (ref 13.0–17.0)
Lymphocytes Relative: 17.1 % (ref 12.0–46.0)
Lymphs Abs: 1.6 10*3/uL (ref 0.7–4.0)
MCHC: 32.6 g/dL (ref 30.0–36.0)
MCV: 98.5 fL (ref 78.0–100.0)
Monocytes Absolute: 0.7 10*3/uL (ref 0.1–1.0)
Monocytes Relative: 7.7 % (ref 3.0–12.0)
Neutro Abs: 6.7 10*3/uL (ref 1.4–7.7)
Neutrophils Relative %: 70.3 % (ref 43.0–77.0)
Platelets: 296 10*3/uL (ref 150.0–400.0)
RBC: 4.83 Mil/uL (ref 4.22–5.81)
RDW: 14.5 % (ref 11.5–15.5)
WBC: 9.5 10*3/uL (ref 4.0–10.5)

## 2023-02-27 LAB — BASIC METABOLIC PANEL
BUN: 1 mg/dL — ABNORMAL LOW (ref 6–23)
CO2: 28 meq/L (ref 19–32)
Calcium: 8.5 mg/dL (ref 8.4–10.5)
Chloride: 102 meq/L (ref 96–112)
Creatinine, Ser: 0.68 mg/dL (ref 0.40–1.50)
GFR: 120.22 mL/min (ref >60.00)
Glucose, Bld: 103 mg/dL — ABNORMAL HIGH (ref 70–99)
Potassium: 3.7 meq/L (ref 3.5–5.1)
Sodium: 141 meq/L (ref 135–145)

## 2023-02-27 LAB — HEPATIC FUNCTION PANEL
ALT: 13 U/L (ref 0–53)
AST: 26 U/L (ref 0–37)
Albumin: 3.2 g/dL — ABNORMAL LOW (ref 3.5–5.2)
Alkaline Phosphatase: 131 U/L — ABNORMAL HIGH (ref 39–117)
Bilirubin, Direct: 0.1 mg/dL (ref 0.0–0.3)
Total Bilirubin: 0.5 mg/dL (ref 0.2–1.2)
Total Protein: 5.9 g/dL — ABNORMAL LOW (ref 6.0–8.3)

## 2023-02-27 LAB — IBC + FERRITIN
Ferritin: 82.9 ng/mL (ref 22.0–322.0)
Iron: 62 ug/dL (ref 42–165)
Saturation Ratios: 19.3 % — ABNORMAL LOW (ref 20.0–50.0)
TIBC: 320.6 ug/dL (ref 250.0–450.0)
Transferrin: 229 mg/dL (ref 212.0–360.0)

## 2023-02-27 LAB — URIC ACID: Uric Acid, Serum: 6.1 mg/dL (ref 4.0–7.8)

## 2023-03-01 ENCOUNTER — Telehealth: Payer: Self-pay | Admitting: Primary Care

## 2023-03-01 NOTE — Telephone Encounter (Signed)
Radiology called stating the pt's ultrasound order needs to be signed by [facility] Spore before his appt on 03/04/23. Call back # [phone]

## 2023-03-04 ENCOUNTER — Emergency Department: Payer: BC Managed Care – PPO

## 2023-03-04 ENCOUNTER — Emergency Department
Admission: EM | Admit: 2023-03-04 | Discharge: 2023-03-05 | Disposition: A | Discharge status: Home / Self Care | Payer: BC Managed Care – PPO | Attending: Emergency Medicine | Admitting: Emergency Medicine

## 2023-03-04 ENCOUNTER — Ambulatory Visit
Admission: RE | Admit: 2023-03-04 | Discharge: 2023-03-04 | Disposition: A | Discharge status: Home / Self Care | Payer: BC Managed Care – PPO | Source: Ambulatory Visit | Attending: Primary Care | Admitting: Primary Care

## 2023-03-04 ENCOUNTER — Other Ambulatory Visit: Payer: Self-pay

## 2023-03-04 DIAGNOSIS — M7989 Other specified soft tissue disorders: Secondary | ICD-10-CM | POA: Diagnosis not present

## 2023-03-04 DIAGNOSIS — M79604 Pain in right leg: Secondary | ICD-10-CM | POA: Diagnosis not present

## 2023-03-04 DIAGNOSIS — R600 Localized edema: Secondary | ICD-10-CM | POA: Insufficient documentation

## 2023-03-04 DIAGNOSIS — I82401 Acute embolism and thrombosis of unspecified deep veins of right lower extremity: Secondary | ICD-10-CM | POA: Diagnosis not present

## 2023-03-04 DIAGNOSIS — I82411 Acute embolism and thrombosis of right femoral vein: Secondary | ICD-10-CM | POA: Diagnosis not present

## 2023-03-04 DIAGNOSIS — I2699 Other pulmonary embolism without acute cor pulmonale: Secondary | ICD-10-CM | POA: Diagnosis not present

## 2023-03-04 DIAGNOSIS — K760 Fatty (change of) liver, not elsewhere classified: Secondary | ICD-10-CM | POA: Diagnosis not present

## 2023-03-04 LAB — CBC WITH DIFFERENTIAL/PLATELET
Abs Immature Granulocytes: 0.07 10*3/uL (ref 0.00–0.07)
Basophils Absolute: 0.1 10*3/uL (ref 0.0–0.1)
Basophils Relative: 1 %
Eosinophils Absolute: 0.4 10*3/uL (ref 0.0–0.5)
Eosinophils Relative: 3 %
HCT: 47.6 % (ref 39.0–52.0)
Hemoglobin: 16.5 g/dL (ref 13.0–17.0)
Immature Granulocytes: 1 %
Lymphocytes Relative: 18 %
Lymphs Abs: 2.2 10*3/uL (ref 0.7–4.0)
MCH: 32.4 pg (ref 26.0–34.0)
MCHC: 34.7 g/dL (ref 30.0–36.0)
MCV: 93.3 fL (ref 80.0–100.0)
Monocytes Absolute: 0.7 10*3/uL (ref 0.1–1.0)
Monocytes Relative: 6 %
Neutro Abs: 8.6 10*3/uL — ABNORMAL HIGH (ref 1.7–7.7)
Neutrophils Relative %: 71 %
Platelets: 249 10*3/uL (ref 150–400)
RBC: 5.1 MIL/uL (ref 4.22–5.81)
RDW: 14.3 % (ref 11.5–15.5)
WBC: 12 10*3/uL — ABNORMAL HIGH (ref 4.0–10.5)
nRBC: 0 % (ref 0.0–0.2)

## 2023-03-04 LAB — BASIC METABOLIC PANEL
Anion gap: 9 (ref 5–15)
BUN: <5 mg/dL — ABNORMAL LOW (ref 6–20)
CO2: 25 mmol/L (ref 22–32)
Calcium: 8.5 mg/dL — ABNORMAL LOW (ref 8.9–10.3)
Chloride: 103 mmol/L (ref 98–111)
Creatinine, Ser: 0.82 mg/dL (ref 0.61–1.24)
GFR, Estimated: >60 mL/min (ref >60)
Glucose, Bld: 93 mg/dL (ref 70–99)
Potassium: 4.2 mmol/L (ref 3.5–5.1)
Sodium: 137 mmol/L (ref 135–145)

## 2023-03-04 LAB — PROTIME-INR
INR: 0.9 (ref 0.8–1.2)
Prothrombin Time: 12.8 s (ref 11.4–15.2)

## 2023-03-04 MED ORDER — RIVAROXABAN (XARELTO) VTE STARTER PACK (15 & 20 MG)
ORAL_TABLET | ORAL | 0 refills | Status: DC
  Filled 2023-03-05: qty 51, 28d supply, fill #0

## 2023-03-04 MED ORDER — IOHEXOL 350 MG/ML SOLN
75.0000 mL | Freq: Once | INTRAVENOUS | Status: AC | PRN
  Administered 2023-03-04: 75 mL via INTRAVENOUS

## 2023-03-04 MED ORDER — RIVAROXABAN 20 MG PO TABS
20.0000 mg | ORAL_TABLET | Freq: Every day | ORAL | Status: DC
Start: 2023-03-26 — End: 2023-03-04

## 2023-03-04 MED ORDER — RIVAROXABAN (XARELTO) EDUCATION KIT FOR DVT/PE PATIENTS
PACK | Freq: Once | Status: AC
  Filled 2023-03-04: qty 1

## 2023-03-04 MED ORDER — RIVAROXABAN 15 MG PO TABS
15.0000 mg | ORAL_TABLET | Freq: Once | ORAL | Status: AC
  Administered 2023-03-05: 15 mg via ORAL
  Filled 2023-03-04: qty 1

## 2023-03-04 MED ORDER — RIVAROXABAN 15 MG PO TABS
15.0000 mg | ORAL_TABLET | Freq: Two times a day (BID) | ORAL | Status: DC
Start: 2023-03-05 — End: 2023-03-04

## 2023-03-04 NOTE — Telephone Encounter (Signed)
Called Radiology department. Order has been signed. Nothing further needed at this time.

## 2023-03-04 NOTE — ED Provider Notes (Signed)
[facility] Emergency Department Provider Note     Event Date/Time   First MD Initiated Contact with Patient 03/04/23 2012     (approximate)   History   DVT   HPI  Dylan Paul is a 37 y.o. male with a history of GERD, and polycythemia vera, presents to the ED for evaluation of a recently diagnosed RLE DVT.  Patient has noted 2 months of intermittent swelling to the right lower extremity.  He had an outpatient ultrasound ordered by his PCP, and was advised to report to the ED after the critical result revealed an occlusive DVT.  Ultrasound available for review does not fact confirm a occlusive DVT through the femoral, popliteal, and peroneal vessels of the right lower extremity.  Patient would also endorse some intermittent right-sided chest pain with exertional shortness of breath.  Denies any cough or hemoptysis.  Physical Exam   Triage Vital Signs: ED Triage Vitals [03/04/23 1853]  Encounter Vitals Group     BP (!) 147/113     Systolic BP Percentile      Diastolic BP Percentile      Pulse Rate 89     Resp 18     Temp 97.9 F (36.6 C)     Temp src      SpO2 100 %     Weight 180 lb (81.6 kg)     Height 5\' 11"  (1.803 m)     Head Circumference      Peak Flow      Pain Score 0     Pain Loc      Pain Education      Exclude from Growth Chart     Most recent vital signs: Vitals:   03/04/23 1853 03/04/23 2253  BP: (!) 147/113 120/87  Pulse: 89 88  Resp: 18 16  Temp: 97.9 F (36.6 C)   SpO2: 100% 100%    General Awake, no distress. NAD HEENT NCAT. PERRL. EOMI. No rhinorrhea. Mucous membranes are moist. * CV:  Good peripheral perfusion. RRR. RLE with non-pitting edema noted from the foot to the proximal knee.  No cyanosis or clubbing noted distally. RESP:  Normal effort. CTA ABD:  No distention.    ED Results / Procedures / Treatments   Labs (all labs ordered are listed, but only abnormal results are displayed) Labs Reviewed   BASIC METABOLIC PANEL - Abnormal; Notable for the following components:      Result Value   BUN <5 (*)    Calcium 8.5 (*)    All other components within normal limits  CBC WITH DIFFERENTIAL/PLATELET - Abnormal; Notable for the following components:   WBC 12.0 (*)    Neutro Abs 8.6 (*)    All other components within normal limits  PROTIME-INR   EKG   RADIOLOGY  I personally viewed and evaluated these images as part of my medical decision making, as well as reviewing the written report by the radiologist.  ED Provider Interpretation: acute RLE occlusive DVT; bilateral nonocclusive pulmonary emboli and small bilateral pulmonary infarcts  CT Angio Chest PE W and/or Wo Contrast  Result Date: 03/04/2023 CLINICAL DATA:  Right lower extremity DVT. Concern for pulmonary embolism. EXAM: CT ANGIOGRAPHY CHEST WITH CONTRAST TECHNIQUE: Multidetector CT imaging of the chest was performed using the standard protocol during bolus administration of intravenous contrast. Multiplanar CT image reconstructions and MIPs were obtained to evaluate the vascular anatomy. RADIATION DOSE REDUCTION: This exam was performed according to  the departmental dose-optimization program which includes automated exposure control, adjustment of the mA and/or kV according to patient size and/or use of iterative reconstruction technique. CONTRAST:  75mL OMNIPAQUE IOHEXOL 350 MG/ML SOLN COMPARISON:  None Available. FINDINGS: Cardiovascular: There is no cardiomegaly or pericardial effusion. The thoracic aorta is unremarkable. Linear and nonocclusive pulmonary artery emboli involving the segmental branches of the right middle lobe, and to lesser volume in the bilateral lower lobes. No CT findings of right heart straining. Mediastinum/Nodes: No hilar or mediastinal adenopathy. The esophagus is grossly unremarkable. No mediastinal fluid collection. Lungs/Pleura: Clusters of subpleural mixed solid and ground-glass density in the right  middle lobe and bilateral lower lobes most consistent with areas of pulmonary infarct. Pneumonia is less likely. No pleural effusion or pneumothorax. The central airways are patent. Upper Abdomen: Herniation of small amount of abdominal fat into the diaphragmatic hiatus. Fatty liver. Musculoskeletal: No acute osseous pathology. Mild chronic appearing compression fracture of the midthoracic vertebra is Review of the MIP images confirms the above findings. 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 No CT evidence of right heart straining. These results were called by telephone at the time of interpretation on 03/04/2023 at 10:25 pm to Dr. Jerline Pain, who verbally acknowledged these results. Electronically Signed   By: Elgie Collard M.D.   On: 03/04/2023 22:30   US Venous Img Lower Unilateral Right (DVT)  Result Date: 03/04/2023 CLINICAL DATA:  Right lower extremity swelling/edema. Polycythemia vera. EXAM: RIGHT LOWER EXTREMITY VENOUS DOPPLER ULTRASOUND TECHNIQUE: Gray-scale sonography with compression, as well as color and duplex ultrasound, were performed to evaluate the deep venous system(s) from the level of the common femoral vein through the popliteal and proximal calf veins. COMPARISON:  None Available. FINDINGS: VENOUS Occlusive thrombus observed in the right femoral vein, popliteal vein, and peroneal vein, compatible with acute DVT. The common femoral vein and profunda femoral vein appear patent. Limited views of the contralateral common femoral vein are unremarkable. OTHER None. Limitations: none IMPRESSION: 1. Occlusive thrombus in the right femoral vein, popliteal vein, and peroneal vein compatible with acute DVT. These results will be called to the ordering clinician or representative by the technologist. If the technologist is unable to get through due to it being after hours, I have instructed her to call me and I will take over the  communication. Electronically Signed   By: Gaylyn Rong M.D.   On: 03/04/2023 18:10     PROCEDURES:  Critical Care performed: Yes, see critical care procedure note(s)  Procedures   MEDICATIONS ORDERED IN ED: Medications  iohexol (OMNIPAQUE) 350 MG/ML injection 75 mL (75 mLs Intravenous Contrast Given 03/04/23 2140)  rivaroxaban (XARELTO) Education Kit for DVT/PE patients ( Does not apply Given 03/05/23 0023)  Rivaroxaban (XARELTO) tablet 15 mg (15 mg Oral Given 03/05/23 0023)     IMPRESSION / MDM / ASSESSMENT AND PLAN / ED COURSE  I reviewed the triage vital signs and the nursing notes.                              Differential diagnosis includes, but is not limited to, viral syndrome, bronchitis including COPD exacerbation, pneumonia, reactive airway disease including asthma, CHF including exacerbation with or without pulmonary/interstitial edema, pneumothorax, ACS, thoracic trauma, and pulmonary embolism.  Patient's presentation is most consistent with acute presentation with potential threat to life or bodily function.  Patient's diagnosis is consistent with acute RLE occlusive DVT  and bilateral nonocclusive pulmonary PEs.  Patient is in stable condition without complaints of a mopped assist or acute shortness of breath.  Who presented on Xarelto for DVT/PE management.  Initial dose of Xarelto 50 mg given in the ED.  Started Kitt is submitted to the patient's pharmacy.  He is otherwise advised to follow-up with his primary provider has been referred to the vascular specialist for ongoing management.  Patient will be discharged home with prescriptions for Xarelto 15/20 starter kit. Patient is to follow up with his PCP and vascular specialist as discussed, as needed or otherwise directed. Patient is given ED precautions to return to the ED for any worsening or new symptoms.   FINAL CLINICAL IMPRESSION(S) / ED DIAGNOSES   Final diagnoses:  Acute deep vein thrombosis (DVT) of right  lower extremity, unspecified vein (HCC)  Acute pulmonary embolism without acute cor pulmonale, unspecified pulmonary embolism type (HCC)     Rx / DC Orders   ED Discharge Orders          Ordered    RIVAROXABAN (XARELTO) VTE STARTER PACK (15 & 20 MG)        03/04/23 2357             Note:  This document was prepared using Dragon voice recognition software and may include unintentional dictation errors.    Lissa Hoard, PA-C 03/05/23 Garey Ham, MD 03/07/23 0003

## 2023-03-04 NOTE — Discharge Instructions (Addendum)
Take the prescription anticoagulation medicine (Xarelto/rivaroxaban) according to the starter kit instructions.  Follow-up with your primary provider for ongoing evaluation and management.  You will likely need several refills after the starter kit, which your primary provider can prescribe.  You should also establish care with vascular specialist for ongoing evaluation management.

## 2023-03-04 NOTE — ED Triage Notes (Signed)
Pt to ED from imaging for DVT to right leg. Does not take blood thinners

## 2023-03-05 ENCOUNTER — Encounter: Payer: Self-pay | Admitting: Oncology

## 2023-03-05 ENCOUNTER — Telehealth: Payer: Self-pay

## 2023-03-05 ENCOUNTER — Other Ambulatory Visit: Payer: Self-pay

## 2023-03-05 MED ORDER — IRBESARTAN 150 MG PO TABS: 150.0000 mg | ORAL_TABLET | Freq: Every day | ORAL | 2 refills | Status: DC

## 2023-03-05 MED ORDER — ROSUVASTATIN CALCIUM 40 MG PO TABS: 40.0000 mg | ORAL_TABLET | Freq: Every day | ORAL | 0 refills | Status: DC

## 2023-03-05 MED ORDER — FENOFIBRATE 160 MG PO TABS: 160.0000 mg | ORAL_TABLET | Freq: Every day | ORAL | 0 refills | Status: DC

## 2023-03-05 MED ORDER — ALLOPURINOL 300 MG PO TABS: 300.0000 mg | ORAL_TABLET | Freq: Every day | ORAL | 3 refills | Status: DC

## 2023-03-05 MED ORDER — TELMISARTAN-AMLODIPINE 40-5 MG PO TABS: 1.0000 | ORAL_TABLET | Freq: Every day | ORAL | 1 refills | Status: DC

## 2023-03-05 MED ORDER — METFORMIN HCL ER 500 MG PO TB24: 1000.0000 mg | ORAL_TABLET | Freq: Every day | ORAL | 2 refills | Status: DC

## 2023-03-05 MED ORDER — GENTAMICIN SULFATE 0.1 % EX OINT: TOPICAL_OINTMENT | Freq: Three times a day (TID) | CUTANEOUS | 0 refills | Status: DC

## 2023-03-05 MED ORDER — CELECOXIB 200 MG PO CAPS: 200.0000 mg | ORAL_CAPSULE | Freq: Every day | ORAL | 1 refills | Status: DC

## 2023-03-05 MED ORDER — AMLODIPINE BESYLATE 5 MG PO TABS: 5.0000 mg | ORAL_TABLET | Freq: Every day | ORAL | 1 refills | Status: DC

## 2023-03-05 MED ORDER — CELECOXIB 200 MG PO CAPS: 200.0000 mg | ORAL_CAPSULE | Freq: Every day | ORAL | 0 refills | Status: DC

## 2023-03-05 MED ORDER — TELMISARTAN 40 MG PO TABS: 40.0000 mg | ORAL_TABLET | Freq: Every day | ORAL | 1 refills | Status: DC

## 2023-03-05 NOTE — Telephone Encounter (Signed)
Per chart review tab pt went to [facility] ED. Sending note to Allayne Gitelman NP and  [facility] pool.

## 2023-03-05 NOTE — Telephone Encounter (Signed)
Noted, will follow-up with patient tomorrow as scheduled.

## 2023-03-06 ENCOUNTER — Ambulatory Visit: Payer: BC Managed Care – PPO | Admitting: Primary Care

## 2023-03-06 ENCOUNTER — Encounter: Payer: Self-pay | Admitting: Primary Care

## 2023-03-06 ENCOUNTER — Other Ambulatory Visit: Payer: Self-pay | Admitting: *Deleted

## 2023-03-06 VITALS — BP 110/76 | HR 88 | Temp 98.1°F | Ht 71.0 in | Wt 178.0 lb

## 2023-03-06 DIAGNOSIS — D751 Secondary polycythemia: Secondary | ICD-10-CM

## 2023-03-06 DIAGNOSIS — I2699 Other pulmonary embolism without acute cor pulmonale: Secondary | ICD-10-CM | POA: Diagnosis not present

## 2023-03-06 DIAGNOSIS — I82411 Acute embolism and thrombosis of right femoral vein: Secondary | ICD-10-CM

## 2023-03-06 DIAGNOSIS — I82409 Acute embolism and thrombosis of unspecified deep veins of unspecified lower extremity: Secondary | ICD-10-CM | POA: Insufficient documentation

## 2023-03-06 NOTE — Assessment & Plan Note (Addendum)
Recent ED visit.  ED notes, labs, imaging reviewed.  Continue Xarelto 15 mg twice daily for 21 days, then switch to 20 mg once daily. Follow-up with hematology tomorrow as scheduled. Follow-up with vascular service in November if warranted.  EKG performed today which reveals normal sinus rhythm with a rate of 77. No acute ST changes or PAC/PVC.

## 2023-03-06 NOTE — Assessment & Plan Note (Signed)
Unclear cause.  He does have a risk factor given his smoking history, but no other risk factors.  Continue Xarelto 15 mg twice daily until day 22 for which she will switch to 20 mg once daily. Discussed to follow-up with hematology for further evaluation.

## 2023-03-06 NOTE — Progress Notes (Signed)
Subjective:    Patient ID: Dylan Paul, male    DOB: Mar 04, 1986, 37 y.o.   MRN: [id number]  HPI  Dylan Paul is a very pleasant 37 y.o. male with a history of polycythemia, chronic left hip pain, bilateral lower extremity weakness, lower extremity swelling who presents today for ED follow-up.  His mother joins [name] today.  Originally evaluated in our office on 02/26/2023 for 3-week history of right lower extremity swelling.  Labs and venous ultrasound ordered for further evaluation.  Venous ultrasound was completed on 03/04/2023 which revealed occlusive thrombus to the right femoral vein, popliteal vein, and peroneal vein compatible with Acute DVT.  Results were received after hours so he was instructed by the technologist to go to the emergency department.  He presented to [facility] ED on 03/04/2023 as instructed.  He also mentioned right-sided chest pain with exertional shortness of breath.  He underwent CTA chest which revealed nonocclusive bilateral pulmonary artery emboli and small bilateral pulmonary infarcts without evidence of right heart strain.  He was initiated on Xarelto starter pack and instructed to follow-up with PCP.  Since his ED visit he's compliant to his Xarelto starter pack. He has an appointment scheduled tomorrow with his hematologist and an appointment scheduled on 04/04/2023 with his vascular specialist.  His right lower extremity swelling has improved some.  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  He denies chest pain upon deep inspiration, exertional fatigue, recent long travel, recent surgery.  His mother denies a family history of clotting disorders.  He currently smokes 1 PPD, has been smoking since the age of 37 years of age. He's tried quitting smoking cold [place] in the past and was unsuccessful.   Review of Systems  Constitutional:  Negative for fatigue.   Respiratory:  Negative for shortness of breath.   Cardiovascular:  Positive for leg swelling. Negative for chest pain.  Skin:  Negative for color change.         Past Medical History:  Diagnosis Date   GERD (gastroesophageal reflux disease)     Social History   Socioeconomic History   Marital status: Legally Separated    Spouse name: Not on file   Number of children: Not on file   Years of education: Not on file   Highest education level: GED or equivalent  Occupational History   Not on file  Tobacco Use   Smoking status: Every Day    Current packs/day: 1.00    Average packs/day: 1 pack/day for 10.0 years (10.0 ttl pk-yrs)    Types: Cigarettes   Smokeless tobacco: Never  Vaping Use   Vaping status: Never Used  Substance and Sexual Activity   Alcohol use: Yes    Comment: has a 6-pack of beer 4-5 times per week   Drug use: Yes    Frequency: 7.0 times per week    Types: Marijuana   Sexual activity: Yes  Other Topics Concern   Not on file  Social History Narrative   ** Merged History Encounter ** Married.   Wife pregnant.   Works at [employer].   Enjoys playing video games.          Right Handed   Lives in a one story home   Social Determinants of Health   Financial Resource Strain: Medium Risk (02/26/2023)   Overall Financial Resource  Strain (CARDIA)    Difficulty of Paying Living Expenses: Somewhat hard  Food Insecurity: No Food Insecurity (02/26/2023)   Hunger Vital Sign    Worried About Running Out of Food in the Last Year: Never true    Ran Out of Food in the Last Year: Never true  Transportation Needs: No Transportation Needs (02/26/2023)   PRAPARE - Administrator, Civil Service (Medical): No    Lack of Transportation (Non-Medical): No  Physical Activity: Insufficiently Active (02/26/2023)   Exercise Vital Sign    Days of Exercise per Week: 2 days    Minutes of Exercise per Session: 20 min  Stress: No Stress Concern  Present (02/26/2023)   [employer] of Occupational Health - Occupational Stress Questionnaire    Feeling of Stress : Only a little  Social Connections: Socially Isolated (02/26/2023)   Social Connection and Isolation Panel [NHANES]    Frequency of Communication with Friends and Family: More than three times a week    Frequency of Social Gatherings with Friends and Family: Twice a week    Attends Religious Services: Never    Database administrator or Organizations: No    Attends Engineer, structural: Not on file    Marital Status: Separated  Intimate Partner Violence: Not on file    Past Surgical History:  Procedure Laterality Date   TONSILLECTOMY     WISDOM TOOTH EXTRACTION      Family History  Problem Relation Age of Onset   Hypertension Mother    Lupus Father        Skin   Heart disease Maternal Grandmother    Skin cancer Paternal Grandmother     No Known Allergies  Current Outpatient Medications on File Prior to Visit  Medication Sig Dispense Refill   b complex vitamins capsule Take 1 capsule by mouth daily.     RIVAROXABAN (XARELTO) VTE STARTER PACK (15 & 20 MG) Follow package directions: Take one 15mg  tablet by mouth twice a day. On day 22, switch to one 20mg  tablet once a day. Take with food. 51 each 0   [DISCONTINUED] allopurinol (ZYLOPRIM) 300 MG tablet Take 1 tablet (300 mg total) by mouth daily. 90 tablet 3   [DISCONTINUED] amLODipine (NORVASC) 5 MG tablet Take 1 tablet (5 mg total) by mouth daily. 90 tablet 1   [DISCONTINUED] fenofibrate 160 MG tablet Take 1 tablet (160 mg total) by mouth daily. 90 tablet 0   [DISCONTINUED] irbesartan (AVAPRO) 150 MG tablet Take 1 tablet (150 mg total) by mouth daily. 90 tablet 2   [DISCONTINUED] metFORMIN (GLUCOPHAGE-XR) 500 MG 24 hr tablet Take 2 tablets (1,000 mg total) by mouth daily with breakfast. 180 tablet 2   [DISCONTINUED] rosuvastatin (CRESTOR) 40 MG tablet Take 1 tablet (40 mg total) by mouth daily. 60  tablet 0   [DISCONTINUED] telmisartan (MICARDIS) 40 MG tablet Take 1 tablet (40 mg total) by mouth at bedtime. 90 tablet 1   [DISCONTINUED] Telmisartan-amLODIPine 40-5 MG TABS Take 1 tablet by mouth daily. 90 tablet 1   No current facility-administered medications on file prior to visit.    BP 110/76   Pulse 88   Temp 98.1 F (36.7 C) (Temporal)   Ht 5\' 11"  (1.803 m)   Wt 178 lb (80.7 kg)   SpO2 98%   BMI 24.83 kg/m  Objective:   Physical Exam Cardiovascular:     Rate and Rhythm: Normal rate and regular rhythm.     Comments: Mild  to moderate lower extremity swelling distal from knee and proximal to ankle.  No pitting.  Improved compared to last visit. Pulmonary:     Effort: Pulmonary effort is normal.     Breath sounds: Normal breath sounds.  Musculoskeletal:     Cervical back: Neck supple.  Skin:    General: Skin is warm and dry.     Findings: No erythema.  Neurological:     Mental Status: He is alert and oriented to person, place, and time.  Psychiatric:        Mood and Affect: Mood normal.           Assessment & Plan:  Bilateral pulmonary embolism [facility]) Assessment & Plan: Recent ED visit.  ED notes, labs, imaging reviewed.  Continue Xarelto 15 mg twice daily for 21 days, then switch to 20 mg once daily. Follow-up with hematology tomorrow as scheduled. Follow-up with vascular service in November if warranted.  EKG performed today which reveals normal sinus rhythm with a rate of 77. No acute ST changes or PAC/PVC.   Orders: -     EKG 12-Lead  Acute deep vein thrombosis (DVT) of femoral vein of right lower extremity (HCC) Assessment & Plan: Unclear cause.  He does have a risk factor given his smoking history, but no other risk factors.  Continue Xarelto 15 mg twice daily until day 22 for which she will switch to 20 mg once daily. Discussed to follow-up with hematology for further evaluation.         Doreene Nest, NP

## 2023-03-06 NOTE — Patient Instructions (Addendum)
Continue taking the Xarelto medication.  Let me know when you are ready to quit smoking.  Follow up with Dr. Orlie Dakin tomorrow as scheduled.   -Ask about further testing for a coagulation disorder -Ask about the duration of treatment with Xarelto -Ask if the vascular appointment is needed  It was a pleasure to see you today!

## 2023-03-07 ENCOUNTER — Inpatient Hospital Stay: Payer: BC Managed Care – PPO

## 2023-03-07 ENCOUNTER — Inpatient Hospital Stay: Payer: BC Managed Care – PPO | Attending: Oncology | Admitting: Oncology

## 2023-03-07 ENCOUNTER — Encounter: Payer: Self-pay | Admitting: Oncology

## 2023-03-07 ENCOUNTER — Inpatient Hospital Stay: Payer: BC Managed Care – PPO | Attending: Oncology

## 2023-03-07 VITALS — BP 114/90 | HR 76 | Temp 98.5°F | Resp 16 | Ht 71.0 in | Wt 178.0 lb

## 2023-03-07 DIAGNOSIS — F17210 Nicotine dependence, cigarettes, uncomplicated: Secondary | ICD-10-CM | POA: Diagnosis not present

## 2023-03-07 DIAGNOSIS — I2699 Other pulmonary embolism without acute cor pulmonale: Secondary | ICD-10-CM | POA: Diagnosis not present

## 2023-03-07 DIAGNOSIS — Z86718 Personal history of other venous thrombosis and embolism: Secondary | ICD-10-CM | POA: Diagnosis not present

## 2023-03-07 DIAGNOSIS — Z7901 Long term (current) use of anticoagulants: Secondary | ICD-10-CM | POA: Diagnosis not present

## 2023-03-07 DIAGNOSIS — Z86711 Personal history of pulmonary embolism: Secondary | ICD-10-CM | POA: Diagnosis not present

## 2023-03-07 DIAGNOSIS — D751 Secondary polycythemia: Secondary | ICD-10-CM | POA: Insufficient documentation

## 2023-03-07 NOTE — Progress Notes (Signed)
[facility]  [phone] [phone]  ID: Dylan Paul OB: 1986-04-24  MR#: [id number]  QIO#:[id number]  Patient Care Team: Doreene Nest, NP as PCP - General (Internal Medicine) Doreene Nest, NP (Internal Medicine) Glendale Chard, DO as Consulting Physician (Neurology)  CHIEF COMPLAINT: Polycythemia, secondary.  DVT, bilateral PE.  INTERVAL HISTORY: Patient returns to clinic today for repeat laboratory work, further evaluation, and consideration of additional phlebotomy.  He recently was evaluated in the emergency room after having a swollen left leg for approximately 1 month.  He was noted to have extensive DVT as well as bilateral PEs.  He is now taking Xarelto and tolerating treatment well.  He currently feels well and is asymptomatic. He has no neurologic complaints.  He denies any recent fevers or illnesses.  He has a good appetite and denies weight loss.  He has no chest pain, shortness of breath, cough, or hemoptysis.  He denies any nausea, vomiting, constipation, or diarrhea.  He has no urinary complaints.  Patient offers no further specific complaints today.  REVIEW OF SYSTEMS:   Review of Systems  Constitutional: Negative.  Negative for fever, malaise/fatigue and weight loss.  Respiratory: Negative.  Negative for cough, hemoptysis and shortness of breath.   Cardiovascular:  Positive for leg swelling. Negative for chest pain.  Gastrointestinal: Negative.  Negative for abdominal pain.  Genitourinary: Negative.  Negative for dysuria.  Musculoskeletal: Negative.  Negative for back pain.  Skin: Negative.  Negative for rash.  Neurological: Negative.  Negative for dizziness, focal weakness, weakness and headaches.  Psychiatric/Behavioral: Negative.  The patient is not nervous/anxious.     As per HPI. Otherwise, a complete review of systems is negative.  PAST MEDICAL HISTORY: Past Medical History:  Diagnosis Date   GERD  (gastroesophageal reflux disease)     PAST SURGICAL HISTORY: Past Surgical History:  Procedure Laterality Date   TONSILLECTOMY     WISDOM TOOTH EXTRACTION      FAMILY HISTORY: Family History  Problem Relation Age of Onset   Hypertension Mother    Lupus Father        Skin   Heart disease Maternal Grandmother    Skin cancer Paternal Grandmother     ADVANCED DIRECTIVES (Y/N):  N  HEALTH MAINTENANCE: Social History   Tobacco Use   Smoking status: Every Day    Current packs/day: 1.00    Average packs/day: 1 pack/day for 10.0 years (10.0 ttl pk-yrs)    Types: Cigarettes   Smokeless tobacco: Never  Vaping Use   Vaping status: Never Used  Substance Use Topics   Alcohol use: Yes    Comment: has a 6-pack of beer 4-5 times per week   Drug use: Yes    Frequency: 7.0 times per week    Types: Marijuana     Colonoscopy:  PAP:  Bone density:  Lipid panel:  No Known Allergies  Current Outpatient Medications  Medication Sig Dispense Refill   b complex vitamins capsule Take 1 capsule by mouth daily.     RIVAROXABAN (XARELTO) VTE STARTER PACK (15 & 20 MG) Follow package directions: Take one 15mg  tablet by mouth twice a day. On day 22, switch to one 20mg  tablet once a day. Take with food. 51 each 0   No current facility-administered medications for this visit.    OBJECTIVE: Vitals:   03/07/23 1415  BP: (!) 114/90  Pulse: 76  Resp: 16  Temp: 98.5 F (36.9 C)  SpO2: 100%  Body mass index is 24.83 kg/m.    ECOG FS:0 - Asymptomatic  General: Well-developed, well-nourished, no acute distress. Eyes: Pink conjunctiva, anicteric sclera. HEENT: Normocephalic, moist mucous membranes. Lungs: No audible wheezing or coughing. Heart: Regular rate and rhythm. Abdomen: Soft, nontender, no obvious distention. Musculoskeletal: Mild right lower extremity edema.   Neuro: Alert, answering all questions appropriately. Cranial nerves grossly intact. Skin: No rashes or petechiae  noted. Psych: Normal affect.   LAB RESULTS:  Lab Results  Component Value Date   NA 137 03/04/2023   K 4.2 03/04/2023   CL 103 03/04/2023   CO2 25 03/04/2023   GLUCOSE 93 03/04/2023   BUN <5 (L) 03/04/2023   CREATININE 0.82 03/04/2023   CALCIUM 8.5 (L) 03/04/2023   PROT 5.9 (L) 02/27/2023   ALBUMIN 3.2 (L) 02/27/2023   AST 26 02/27/2023   ALT 13 02/27/2023   ALKPHOS 131 (H) 02/27/2023   BILITOT 0.5 02/27/2023   GFRNONAA >60 03/04/2023    Lab Results  Component Value Date   WBC 12.0 (H) 03/04/2023   NEUTROABS 8.6 (H) 03/04/2023   HGB 16.5 03/04/2023   HCT 47.6 03/04/2023   MCV 93.3 03/04/2023   PLT 249 03/04/2023     STUDIES: CT Angio Chest PE W and/or Wo Contrast  Result Date: 03/04/2023 CLINICAL DATA:  Right lower extremity DVT. Concern for pulmonary embolism. EXAM: CT ANGIOGRAPHY CHEST WITH CONTRAST TECHNIQUE: Multidetector CT imaging of the chest was performed using the standard protocol during bolus administration of intravenous contrast. Multiplanar CT image reconstructions and MIPs were obtained to evaluate the vascular anatomy. RADIATION DOSE REDUCTION: This exam was performed according to the departmental dose-optimization program which includes automated exposure control, adjustment of the mA and/or kV according to patient size and/or use of iterative reconstruction technique. CONTRAST:  75mL OMNIPAQUE IOHEXOL 350 MG/ML SOLN COMPARISON:  None Available. FINDINGS: Cardiovascular: There is no cardiomegaly or pericardial effusion. The thoracic aorta is unremarkable. Linear and nonocclusive pulmonary artery emboli involving the segmental branches of the right middle lobe, and to lesser volume in the bilateral lower lobes. No CT findings of right heart straining. Mediastinum/Nodes: No hilar or mediastinal adenopathy. The esophagus is grossly unremarkable. No mediastinal fluid collection. Lungs/Pleura: Clusters of subpleural mixed solid and ground-glass density in the right  middle lobe and bilateral lower lobes most consistent with areas of pulmonary infarct. Pneumonia is less likely. No pleural effusion or pneumothorax. The central airways are patent. Upper Abdomen: Herniation of small amount of abdominal fat into the diaphragmatic hiatus. Fatty liver. Musculoskeletal: No acute osseous pathology. Mild chronic appearing compression fracture of the midthoracic vertebra is Review of the MIP images confirms the above findings. 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 No CT evidence of right heart straining. These results were called by telephone at the time of interpretation on 03/04/2023 at 10:25 pm to Dr. Jerline Pain, who verbally acknowledged these results. Electronically Signed   By: Elgie Collard M.D.   On: 03/04/2023 22:30   US Venous Img Lower Unilateral Right (DVT)  Result Date: 03/04/2023 CLINICAL DATA:  Right lower extremity swelling/edema. Polycythemia vera. EXAM: RIGHT LOWER EXTREMITY VENOUS DOPPLER ULTRASOUND TECHNIQUE: Gray-scale sonography with compression, as well as color and duplex ultrasound, were performed to evaluate the deep venous system(s) from the level of the common femoral vein through the popliteal and proximal calf veins. COMPARISON:  None Available. FINDINGS: VENOUS Occlusive thrombus observed in the right femoral vein, popliteal vein, and peroneal vein, compatible with acute  DVT. The common femoral vein and profunda femoral vein appear patent. Limited views of the contralateral common femoral vein are unremarkable. OTHER None. Limitations: none IMPRESSION: 1. Occlusive thrombus in the right femoral vein, popliteal vein, and peroneal vein compatible with acute DVT. These results will be called to the ordering clinician or representative by the technologist. If the technologist is unable to get through due to it being after hours, I have instructed her to call me and I will take over the  communication. Electronically Signed   By: Gaylyn Rong M.D.   On: 03/04/2023 18:10    ASSESSMENT: Polycythemia, secondary. DVT, bilateral PE  PLAN:    Polycythemia, secondary: Likely due to continued tobacco use.  Patient has an elevated carbon monoxide level.  All of patient's other laboratory work including iron stores and Jak 2 mutation are either negative or within normal limits.  Will order JAK2 with reflex with next lab draw.  Patient does not require additional phlebotomy today.  Return to clinic in 3 months with repeat laboratory work, further evaluation, and continuation of phlebotomy if needed.    Smoking cessation: Patient has no desire to quit at this time. Macrocytosis: Previously, B12 and folate were within normal limits.  Possibly related to alcohol use.  Monitor. Right lower extremity DVT/bilateral PE: Patient has no apparent transient risk factors.  He will require a minimum of 6 months of Xarelto completing treatment in April 2025.  Will do full hypercoagulable workup with next blood draw in 3 months.  I spent a total of 30 minutes reviewing chart data, face-to-face evaluation with the patient, counseling and coordination of care as detailed above.  Patient expressed understanding and was in agreement with this plan. He also understands that He can call clinic at any time with any questions, concerns, or complaints.    Jeralyn Ruths, MD   03/07/2023 4:30 PM

## 2023-03-26 ENCOUNTER — Other Ambulatory Visit: Payer: Self-pay | Admitting: Primary Care

## 2023-03-26 ENCOUNTER — Other Ambulatory Visit: Payer: Self-pay | Admitting: Physician Assistant

## 2023-03-26 ENCOUNTER — Other Ambulatory Visit: Payer: Self-pay

## 2023-03-26 DIAGNOSIS — I82411 Acute embolism and thrombosis of right femoral vein: Secondary | ICD-10-CM

## 2023-03-26 DIAGNOSIS — I2699 Other pulmonary embolism without acute cor pulmonale: Secondary | ICD-10-CM

## 2023-03-26 MED ORDER — RIVAROXABAN 20 MG PO TABS
20.0000 mg | ORAL_TABLET | Freq: Every day | ORAL | 0 refills | Status: AC
Start: 2023-03-26 — End: ?
  Filled 2023-03-26: qty 30, 30d supply, fill #0
  Filled 2023-05-01: qty 30, 30d supply, fill #1
  Filled 2023-06-01: qty 30, 30d supply, fill #2

## 2023-04-02 ENCOUNTER — Encounter: Payer: Self-pay | Admitting: Oncology

## 2023-04-02 ENCOUNTER — Other Ambulatory Visit: Payer: Self-pay

## 2023-04-03 NOTE — Progress Notes (Deleted)
MRN : [id number]  Dylan Paul is a 37 y.o. (May 09, 1986) male who presents with chief complaint of legs hurt and swell.  History of Present Illness:   The patient presents to the office for evaluation of DVT/PE.  DVT was identified at [facility] by Duplex ultrasound of the right lower extremity dated 03/04/2023.  Findings are reported as occlusive thrombus within the right superficial femoral vein and right popliteal vein as well as some tibial involvement.  PE was identified by CT angio of the chest dated 03/04/2023.  Findings on CT included bilateral segmental and subsegmental PE.  I have personally reviewed both studies and concur with the duplex ultrasound of the right lower extremity.  With respect to the CT angiogram I agree with segmental and subsegmental thrombus within the right upper and middle lobes.  I do not see significant involvement of the right lower lobe.  I do not identify any significant thrombus on the left side.  The initial symptoms were pain and swelling in the lower extremity.  In association with shortness of breath and chest pain.  The patient notes the affected leg is painful with dependency and swells.  Symptoms are much better with elevation.  The patient notes minimal edema in the morning which steadily worsens throughout the day.    The patient has not been using compression therapy at this point.  No worsening SOB or pleuritic chest pains.  No cough or hemoptysis.  No blood per rectum or blood in any sputum.  No excessive bruising per the patient.   No recent shortening of the patient's walking distance or new symptoms consistent with claudication.  No history of rest pain symptoms. No new ulcers or wounds of the lower extremities have occurred.  The patient denies amaurosis fugax or recent TIA symptoms. There are no recent neurological changes noted. No recent episodes of angina or shortness of breath documented.   No outpatient medications have  been marked as taking for the 04/04/23 encounter (Appointment) with Gilda Crease, Latina Craver, MD.    Past Medical History:  Diagnosis Date   GERD (gastroesophageal reflux disease)     Past Surgical History:  Procedure Laterality Date   TONSILLECTOMY     WISDOM TOOTH EXTRACTION      Social History Social History   Tobacco Use   Smoking status: Every Day    Current packs/day: 1.00    Average packs/day: 1 pack/day for 10.0 years (10.0 ttl pk-yrs)    Types: Cigarettes   Smokeless tobacco: Never  Vaping Use   Vaping status: Never Used  Substance Use Topics   Alcohol use: Yes    Comment: has a 6-pack of beer 4-5 times per week   Drug use: Yes    Frequency: 7.0 times per week    Types: Marijuana    Family History Family History  Problem Relation Age of Onset   Hypertension Mother    Lupus Father        Skin   Heart disease Maternal Grandmother    Skin cancer Paternal Grandmother     No Known Allergies   REVIEW OF SYSTEMS (Negative unless checked)  Constitutional: [] Weight loss  [] Fever  [] Chills Cardiac: [] Chest pain   [] Chest pressure   [] Palpitations   [] Shortness of breath when laying flat   [] Shortness of breath with exertion. Vascular:  [] Pain in legs with walking   [x] Pain in legs at rest  [] History of DVT   [] Phlebitis   [  x]Swelling in legs   [] Varicose veins   [] Non-healing ulcers Pulmonary:   [] Uses home oxygen   [] Productive cough   [] Hemoptysis   [] Wheeze  [] COPD   [] Asthma Neurologic:  [] Dizziness   [] Seizures   [] History of stroke   [] History of TIA  [] Aphasia   [] Vissual changes   [] Weakness or numbness in arm   [] Weakness or numbness in leg Musculoskeletal:   [] Joint swelling   [] Joint pain   [] Low back pain Hematologic:  [] Easy bruising  [] Easy bleeding   [] Hypercoagulable state   [] Anemic Gastrointestinal:  [] Diarrhea   [] Vomiting  [x] Gastroesophageal reflux/heartburn   [] Difficulty swallowing. Genitourinary:  [] Chronic kidney disease   [] Difficult urination   [] Frequent urination   [] Blood in urine Skin:  [] Rashes   [] Ulcers  Psychological:  [] History of anxiety   []  History of major depression.  Physical Examination  There were no vitals filed for this visit. There is no height or weight on file to calculate BMI. Gen: WD/WN, NAD Head: [place]/AT, No temporalis wasting.  Ear/Nose/Throat: Hearing grossly intact, nares w/o erythema or drainage, pinna without lesions Eyes: PER, EOMI, sclera nonicteric.  Neck: Supple, no gross masses.  No JVD.  Pulmonary:  Good air movement, no audible wheezing, no use of accessory muscles.  Cardiac: RRR, precordium not hyperdynamic. Vascular:  scattered varicosities present bilaterally.  Moderate venous stasis changes to the legs bilaterally.  2+ soft pitting edema. CEAP [id number]   Vessel Right Left  Radial Palpable Palpable  Gastrointestinal: soft, non-distended. No guarding/no peritoneal signs.  Musculoskeletal: M/S 5/5 throughout.  No deformity.  Neurologic: CN 2-12 intact. Pain and light touch intact in extremities.  Symmetrical.  Speech is fluent. Motor exam as listed above. Psychiatric: Judgment intact, Mood & affect appropriate for pt's clinical situation. Dermatologic: Venous rashes no ulcers noted.  No changes consistent with cellulitis. Lymph : No lichenification or skin changes of chronic lymphedema.  CBC Lab Results  Component Value Date   WBC 12.0 (H) 03/04/2023   HGB 16.5 03/04/2023   HCT 47.6 03/04/2023   MCV 93.3 03/04/2023   PLT 249 03/04/2023    BMET    Component Value Date/Time   NA 137 03/04/2023 2057   K 4.2 03/04/2023 2057   CL 103 03/04/2023 2057   CO2 25 03/04/2023 2057   GLUCOSE 93 03/04/2023 2057   BUN <5 (L) 03/04/2023 2057   CREATININE 0.82 03/04/2023 2057   CREATININE 0.98 01/22/2020 1459   CALCIUM 8.5 (L) 03/04/2023 2057   GFRNONAA >60 03/04/2023 2057   CrCl cannot be calculated (Patient's most recent lab result is older than the maximum 21 days  allowed.).  COAG Lab Results  Component Value Date   INR 0.9 03/04/2023    Radiology CT Angio Chest PE W and/or Wo Contrast  Result Date: 03/04/2023 CLINICAL DATA:  Right lower extremity DVT. Concern for pulmonary embolism. EXAM: CT ANGIOGRAPHY CHEST WITH CONTRAST TECHNIQUE: Multidetector CT imaging of the chest was performed using the standard protocol during bolus administration of intravenous contrast. Multiplanar CT image reconstructions and MIPs were obtained to evaluate the vascular anatomy. RADIATION DOSE REDUCTION: This exam was performed according to the departmental dose-optimization program which includes automated exposure control, adjustment of the mA and/or kV according to patient size and/or use of iterative reconstruction technique. CONTRAST:  75mL OMNIPAQUE IOHEXOL 350 MG/ML SOLN COMPARISON:  None Available. FINDINGS: Cardiovascular: There is no cardiomegaly or pericardial effusion. The thoracic aorta is unremarkable. Linear and nonocclusive pulmonary artery emboli involving the segmental  branches of the right middle lobe, and to lesser volume in the bilateral lower lobes. No CT findings of right heart straining. Mediastinum/Nodes: No hilar or mediastinal adenopathy. The esophagus is grossly unremarkable. No mediastinal fluid collection. Lungs/Pleura: Clusters of subpleural mixed solid and ground-glass density in the right middle lobe and bilateral lower lobes most consistent with areas of pulmonary infarct. Pneumonia is less likely. No pleural effusion or pneumothorax. The central airways are patent. Upper Abdomen: Herniation of small amount of abdominal fat into the diaphragmatic hiatus. Fatty liver. Musculoskeletal: No acute osseous pathology. Mild chronic appearing compression fracture of the midthoracic vertebra is Review of the MIP images confirms the above findings. 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 No CT evidence of  right heart straining. These results were called by telephone at the time of interpretation on 03/04/2023 at 10:25 pm to Dr. Jerline Pain, who verbally acknowledged these results. Electronically Signed   By: Elgie Collard M.D.   On: 03/04/2023 22:30   US Venous Img Lower Unilateral Right (DVT)  Result Date: 03/04/2023 CLINICAL DATA:  Right lower extremity swelling/edema. Polycythemia vera. EXAM: RIGHT LOWER EXTREMITY VENOUS DOPPLER ULTRASOUND TECHNIQUE: Gray-scale sonography with compression, as well as color and duplex ultrasound, were performed to evaluate the deep venous system(s) from the level of the common femoral vein through the popliteal and proximal calf veins. COMPARISON:  None Available. FINDINGS: VENOUS Occlusive thrombus observed in the right femoral vein, popliteal vein, and peroneal vein, compatible with acute DVT. The common femoral vein and profunda femoral vein appear patent. Limited views of the contralateral common femoral vein are unremarkable. OTHER None. Limitations: none IMPRESSION: 1. Occlusive thrombus in the right femoral vein, popliteal vein, and peroneal vein compatible with acute DVT. These results will be called to the ordering clinician or representative by the technologist. If the technologist is unable to get through due to it being after hours, I have instructed her to call me and I will take over the communication. Electronically Signed   By: Gaylyn Rong M.D.   On: 03/04/2023 18:10     Assessment/Plan There are no diagnoses linked to this encounter.   Levora Dredge, MD  04/03/2023 8:58 AM

## 2023-04-04 ENCOUNTER — Encounter (INDEPENDENT_AMBULATORY_CARE_PROVIDER_SITE_OTHER): Payer: Self-pay | Admitting: Vascular Surgery

## 2023-04-04 DIAGNOSIS — K219 Gastro-esophageal reflux disease without esophagitis: Secondary | ICD-10-CM

## 2023-04-04 DIAGNOSIS — I82411 Acute embolism and thrombosis of right femoral vein: Secondary | ICD-10-CM

## 2023-04-04 DIAGNOSIS — I2699 Other pulmonary embolism without acute cor pulmonale: Secondary | ICD-10-CM

## 2023-06-02 ENCOUNTER — Other Ambulatory Visit: Payer: Self-pay

## 2023-06-07 ENCOUNTER — Inpatient Hospital Stay: Payer: BC Managed Care – PPO | Attending: Oncology

## 2023-06-20 ENCOUNTER — Inpatient Hospital Stay: Payer: BC Managed Care – PPO

## 2023-06-20 ENCOUNTER — Encounter: Payer: Self-pay | Admitting: Oncology

## 2023-06-20 ENCOUNTER — Inpatient Hospital Stay: Payer: BC Managed Care – PPO | Admitting: Oncology

## 2023-07-05 ENCOUNTER — Encounter: Payer: Self-pay | Admitting: Oncology

## 2023-10-15 ENCOUNTER — Encounter (INDEPENDENT_AMBULATORY_CARE_PROVIDER_SITE_OTHER): Payer: Self-pay

## 2023-12-04 ENCOUNTER — Ambulatory Visit
Admission: RE | Admit: 2023-12-04 | Discharge: 2023-12-04 | Disposition: A | Discharge status: Home / Self Care | Source: Ambulatory Visit | Attending: Emergency Medicine | Admitting: Emergency Medicine

## 2023-12-04 VITALS — BP 139/81 | HR 87 | Temp 98.7°F | Resp 18

## 2023-12-04 DIAGNOSIS — H109 Unspecified conjunctivitis: Secondary | ICD-10-CM | POA: Diagnosis not present

## 2023-12-04 MED ORDER — MOXIFLOXACIN HCL 0.5 % OP SOLN: 1.0000 [drp] | Freq: Three times a day (TID) | OPHTHALMIC | 0 refills | Status: AC

## 2023-12-04 NOTE — Discharge Instructions (Signed)
 Today you being treated for bacterial conjunctivitis.   Place one drop of moxifloxacin  into the effected eye every 8 hours while awake for 7 days. If the other eye starts to have symptoms you may use medication in it as well. Do not allow tip of dropper to touch eye.  May use cool compress for comfort and to remove discharge if present. Pat the eye, do not wipe.  Do not rub eyes, this may cause more irritation.  May use Claritin, zyrtec benadryl as needed to help if itching present.  If symptoms persist after use of medication, please follow up at Urgent Care or with ophthalmologist (eye doctor)

## 2023-12-04 NOTE — ED Provider Notes (Signed)
 Dylan Paul    CSN: [id number] Arrival date & time: 12/04/23  1423      History   Chief Complaint Chief Complaint  Patient presents with   Eye Problem    Swollen red left eye. Watery discharge. Slightly painful - Entered by patient    HPI Dylan Paul is a 38 y.o. male.   Patient presents for evaluation of erythema, pain, drainage, mild pruritus and swelling present to the left eye beginning 1 day ago.  Attempted warm compress with no improvement.  No sick contact with similar symptoms.  Denies use of contacts.  Able to see clearly.  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 Authorizing Provider  moxifloxacin  (VIGAMOX ) 0.5 % ophthalmic solution Place 1 drop into the left eye 3 (three) times daily. 12/04/23  Yes Pranav Lince R, NP  b complex vitamins capsule Take 1 capsule by mouth daily.    [provider]  rivaroxaban  (XARELTO ) 20 MG TABS tablet Take 1 tablet (20 mg total) by mouth daily with supper. 03/26/23   Clark, Katherine K, NP  allopurinol  (ZYLOPRIM ) 300 MG tablet Take 1 tablet (300 mg total) by mouth daily. 11/07/22 03/05/23  Marylynn Verneita CROME, MD  amLODipine  (NORVASC ) 5 MG tablet Take 1 tablet (5 mg total) by mouth daily. 11/12/22 03/05/23  Marylynn Verneita CROME, MD   fenofibrate  160 MG tablet Take 1 tablet (160 mg total) by mouth daily. 11/12/22 03/05/23  Marylynn Verneita CROME, MD  irbesartan  (AVAPRO ) 150 MG tablet Take 1 tablet (150 mg total) by mouth daily. 11/12/22 03/05/23  Marylynn Verneita CROME, MD  metFORMIN  (GLUCOPHAGE -XR) 500 MG 24 hr tablet Take 2 tablets (1,000 mg total) by mouth daily with breakfast. 11/12/22 03/05/23  Marylynn Verneita CROME, MD  rosuvastatin  (CRESTOR ) 40 MG tablet Take 1 tablet (40 mg total) by mouth daily. 08/17/22 03/05/23  Marylynn Verneita CROME, MD  telmisartan  (MICARDIS ) 40 MG tablet Take 1 tablet (40 mg total) by mouth at bedtime. 08/14/22 03/05/23  Marylynn Verneita CROME, MD  Telmisartan -amLODIPine  40-5 MG TABS Take 1 tablet by mouth daily. 08/14/22 03/05/23  Marylynn Verneita CROME, MD    Family History Family History  Problem Relation Age of Onset   Hypertension Mother    Lupus Father        Skin   Heart disease Maternal Grandmother    Skin cancer Paternal Grandmother     Social History Social History   Tobacco Use   Smoking status: Every Day    Current packs/day: 1.00    Average packs/day: 1 pack/day for 10.0 years (10.0 ttl pk-yrs)    Types: Cigarettes   Smokeless tobacco: Never  Vaping Use   Vaping status: Never Used  Substance Use Topics   Alcohol use: Yes  Comment: has a 6-pack of beer 4-5 times per week   Drug use: Yes    Frequency: 7.0 times per week    Types: Marijuana     Allergies   Patient has no known allergies.   Review of Systems Review of Systems   Physical Exam Triage Vital Signs ED Triage Vitals  Encounter Vitals Group     BP 12/04/23 1429 139/81     Girls Systolic BP Percentile --      Girls Diastolic BP Percentile --      Boys Systolic BP Percentile --      Boys Diastolic BP Percentile --      Pulse Rate 12/04/23 1429 87     Resp 12/04/23 1429 18     Temp 12/04/23 1429 98.7 F (37.1 C)     Temp Source 12/04/23 1429 Oral     SpO2 12/04/23 1429 97 %     Weight --      Height --      Head Circumference --       Peak Flow --      Pain Score 12/04/23 1431 3     Pain Loc --      Pain Education --      Exclude from Growth Chart --    No data found.  Updated Vital Signs BP 139/81 (BP Location: Left Arm)   Pulse 87   Temp 98.7 F (37.1 C) (Oral)   Resp 18   SpO2 97%   Visual Acuity Right Eye Distance: 20/20 Left Eye Distance: 20/25 Bilateral Distance: 20/16  Right Eye Near:   Left Eye Near:    Bilateral Near:     Physical Exam Constitutional:      Appearance: Normal appearance.  Eyes:     Comments: Erythema present to the left conjunctiva with mild periorbital swelling, scant yellow purulent drainage present along the lower lashes, vision grossly intact, extraocular movements intact  Pulmonary:     Effort: Pulmonary effort is normal.  Neurological:     Mental Status: He is alert and oriented to person, place, and time. Mental status is at baseline.      UC Treatments / Results  Labs (all labs ordered are listed, but only abnormal results are displayed) Labs Reviewed - No data to display  EKG   Radiology No results found.  Procedures Procedures (including critical care time)  Medications Ordered in UC Medications - No data to display  Initial Impression / Assessment and Plan / UC Course  I have reviewed the triage vital signs and the nursing notes.  Pertinent labs & imaging results that were available during my care of the patient were reviewed by me and considered in my medical decision making (see chart for details).  Bacterial conjunctivitis of left eye  Presentation and symptomology consistent with above diagnosis, discussed with patient, prescribed moxifloxacin  and discussed administration, recommended supportive care for management of pain and pruritus and advised follow-up if no improvement seen Final Clinical Impressions(s) / UC Diagnoses   Final diagnoses:  Bacterial conjunctivitis of left eye     Discharge Instructions      Today you being treated  for bacterial conjunctivitis.   Place one drop of moxifloxacin  into the effected eye every 8 hours while awake for 7 days. If the other eye starts to have symptoms you may use medication in it as well. Do not allow tip of dropper to touch eye.  May use cool compress for comfort and to  remove discharge if present. Pat the eye, do not wipe.  Do not rub eyes, this may cause more irritation.  May use Claritin, zyrtec benadryl as needed to help if itching present.  If symptoms persist after use of medication, please follow up at Urgent Care or with ophthalmologist (eye doctor)    ED Prescriptions     Medication Sig Dispense Auth. Provider   moxifloxacin  (VIGAMOX ) 0.5 % ophthalmic solution Place 1 drop into the left eye 3 (three) times daily. 3 mL Teresa Shelba SAUNDERS, NP      PDMP not reviewed this encounter.   Teresa Shelba SAUNDERS, NP 12/04/23 1447

## 2023-12-04 NOTE — ED Triage Notes (Signed)
 Patient complains of pain, drainage, itching and swelling of left eye x 1 day. Rates pain 3/10.

## 2024-03-19 ENCOUNTER — Ambulatory Visit
Admission: RE | Admit: 2024-03-19 | Discharge: 2024-03-19 | Disposition: A | Discharge status: Home / Self Care | Source: Ambulatory Visit | Attending: Emergency Medicine | Admitting: Emergency Medicine

## 2024-03-19 VITALS — BP 129/83 | HR 78 | Temp 98.2°F | Resp 19

## 2024-03-19 DIAGNOSIS — B349 Viral infection, unspecified: Secondary | ICD-10-CM

## 2024-03-19 DIAGNOSIS — R112 Nausea with vomiting, unspecified: Secondary | ICD-10-CM

## 2024-03-19 LAB — POC SOFIA SARS ANTIGEN FIA: SARS Coronavirus 2 Ag: NEGATIVE

## 2024-03-19 MED ORDER — ONDANSETRON 4 MG PO TBDP
4.0000 mg | ORAL_TABLET | Freq: Three times a day (TID) | ORAL | 0 refills | Status: AC | PRN
Start: 2024-03-19 — End: ?

## 2024-03-19 NOTE — ED Triage Notes (Signed)
 Patient to Urgent Care with complaints of headaches/ nausea/ fevers. Emesis this morning.  Symptoms x3 days.   Meds: ibuprofen  yesterday.

## 2024-03-19 NOTE — Discharge Instructions (Addendum)
 Take the antinausea medication as directed.    Keep yourself hydrated with clear liquids, such as water.  Advance your diet as tolerated.   Go to the emergency department if you have worsening symptoms.    Follow up with your primary care provider.

## 2024-03-19 NOTE — ED Provider Notes (Signed)
 [name]    CSN: [id number] Arrival date & time: 03/19/24  1511      History   Chief Complaint Chief Complaint  Patient presents with   Headache    Nausea, running fever - Entered by patient    HPI Jamerius Boeckman is a 38 y.o. male.  Patient presents with 2 to 3-day history of fever, headache, nausea.  Tmax 100.8.  Patient reports 1 episode vomiting this morning and 1 yesterday morning.  No diarrhea, abdominal pain, dysuria, hematuria, ear pain, sore throat, cough, shortness of breath.  No OTC medications taken today.  The history is provided by the patient and medical records.    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 Authorizing Provider  ondansetron (ZOFRAN-ODT) 4 MG disintegrating tablet Take 1 tablet (4 mg total) by mouth every 8 (eight) hours as needed for nausea or vomiting. 03/19/24  Yes Corlis Burnard DEL, NP  b complex vitamins capsule Take 1 capsule by mouth daily.    [provider]  moxifloxacin  (VIGAMOX ) 0.5 % ophthalmic solution Place 1 drop into the left eye 3 (three) times daily. Patient not taking: Reported on 03/19/2024 12/04/23   Teresa Shelba SAUNDERS, NP  rivaroxaban  (XARELTO ) 20 MG TABS tablet Take 1 tablet (20 mg total) by mouth daily with  supper. Patient not taking: Reported on 03/19/2024 03/26/23   Clark, Katherine K, NP  allopurinol  (ZYLOPRIM ) 300 MG tablet Take 1 tablet (300 mg total) by mouth daily. 11/07/22 03/05/23  Marylynn Verneita CROME, MD  amLODipine  (NORVASC ) 5 MG tablet Take 1 tablet (5 mg total) by mouth daily. 11/12/22 03/05/23  Marylynn Verneita CROME, MD  fenofibrate  160 MG tablet Take 1 tablet (160 mg total) by mouth daily. 11/12/22 03/05/23  Marylynn Verneita CROME, MD  irbesartan  (AVAPRO ) 150 MG tablet Take 1 tablet (150 mg total) by mouth daily. 11/12/22 03/05/23  Marylynn Verneita CROME, MD  metFORMIN  (GLUCOPHAGE -XR) 500 MG 24 hr tablet Take 2 tablets (1,000 mg total) by mouth daily with breakfast. 11/12/22 03/05/23  Marylynn Verneita CROME, MD  rosuvastatin  (CRESTOR ) 40 MG tablet Take 1 tablet (40 mg total) by mouth daily. 08/17/22 03/05/23  Marylynn Verneita CROME, MD  telmisartan  (MICARDIS ) 40 MG tablet Take 1 tablet (40 mg total) by mouth at bedtime. 08/14/22 03/05/23  Marylynn Verneita CROME, MD  Telmisartan -amLODIPine  40-5 MG TABS Take 1 tablet by mouth daily. 08/14/22 03/05/23  Marylynn Verneita CROME, MD    Family History Family History  Problem Relation Age of Onset   Hypertension Mother    Lupus Father        Skin   Heart disease Maternal Grandmother    Skin cancer Paternal Grandmother     Social History Social History   Tobacco Use   Smoking status:  Every Day    Current packs/day: 1.00    Average packs/day: 1 pack/day for 10.0 years (10.0 ttl pk-yrs)    Types: Cigarettes   Smokeless tobacco: Never  Vaping Use   Vaping status: Never Used  Substance Use Topics   Alcohol use: Yes    Comment: has a 6-pack of beer 4-5 times per week   Drug use: Yes    Frequency: 7.0 times per week    Types: Marijuana     Allergies   Patient has no known allergies.   Review of Systems Review of Systems  Constitutional:  Negative for chills and fever.  HENT:  Negative for ear pain and sore throat.   Respiratory:  Negative for cough and shortness of breath.    Gastrointestinal:  Positive for nausea and vomiting. Negative for abdominal pain, blood in stool, constipation and diarrhea.  Neurological:  Positive for headaches.     Physical Exam Triage Vital Signs ED Triage Vitals  Encounter Vitals Group     BP 03/19/24 1530 129/83     Girls Systolic BP Percentile --      Girls Diastolic BP Percentile --      Boys Systolic BP Percentile --      Boys Diastolic BP Percentile --      Pulse Rate 03/19/24 1530 78     Resp 03/19/24 1530 19     Temp 03/19/24 1530 98.2 F (36.8 C)     Temp src --      SpO2 03/19/24 1530 98 %     Weight --      Height --      Head Circumference --      Peak Flow --      Pain Score 03/19/24 1527 7     Pain Loc --      Pain Education --      Exclude from Growth Chart --    No data found.  Updated Vital Signs BP 129/83   Pulse 78   Temp 98.2 F (36.8 C)   Resp 19   SpO2 98%   Visual Acuity Right Eye Distance:   Left Eye Distance:   Bilateral Distance:    Right Eye Near:   Left Eye Near:    Bilateral Near:     Physical Exam Constitutional:      General: He is not in acute distress. HENT:     Right Ear: Tympanic membrane normal.     Left Ear: Tympanic membrane normal.     Nose: Nose normal.     Mouth/Throat:     Mouth: Mucous membranes are moist.     Pharynx: Oropharynx is clear.  Cardiovascular:     Rate and Rhythm: Normal rate and regular rhythm.     Heart sounds: Normal heart sounds.  Pulmonary:     Effort: Pulmonary effort is normal. No respiratory distress.     Breath sounds: Normal breath sounds.  Abdominal:     General: Bowel sounds are normal.     Palpations: Abdomen is soft.     Tenderness: There is no abdominal tenderness. There is no guarding or rebound.  Neurological:     Mental Status: He is alert.      UC Treatments / Results  Labs (all labs ordered are listed, but only abnormal results are displayed) Labs Reviewed  POC SOFIA SARS ANTIGEN FIA     EKG   Radiology No results found.  Procedures Procedures (including critical care time)  Medications  Ordered in UC Medications - No data to display  Initial Impression / Assessment and Plan / UC Course  I have reviewed the triage vital signs and the nursing notes.  Pertinent labs & imaging results that were available during my care of the patient were reviewed by me and considered in my medical decision making (see chart for details).    Nausea and vomiting, viral illness.  Afebrile and vital signs are stable.  COVID negative.  Patient reports he has been able to eat lightly and drink fluids without difficulty.  Treating nausea and vomiting with Zofran.  Discussed clear liquid diet.  Instructed patient to advance diet as tolerated.  Discussed maintaining oral hydration at home; ED precautions discussed.  Education provided on nausea and vomiting, viral illness.  Instructed patient to follow-up with his PCP.  He agrees to plan of care.   Final Clinical Impressions(s) / UC Diagnoses   Final diagnoses:  Nausea and vomiting, unspecified vomiting type  Viral illness     Discharge Instructions      Take the antinausea medication as directed.    Keep yourself hydrated with clear liquids, such as water.  Advance your diet as tolerated.   Go to the emergency department if you have worsening symptoms.    Follow up with your primary care provider.          ED Prescriptions     Medication Sig Dispense Auth. Provider   ondansetron (ZOFRAN-ODT) 4 MG disintegrating tablet Take 1 tablet (4 mg total) by mouth every 8 (eight) hours as needed for nausea or vomiting. 20 tablet Corlis Burnard DEL, NP      PDMP not reviewed this encounter.   Corlis Burnard DEL, NP 03/19/24 [phone]

## 2024-04-21 ENCOUNTER — Emergency Department

## 2024-04-21 ENCOUNTER — Emergency Department: Admission: EM | Admit: 2024-04-21 | Discharge: 2024-04-21 | Disposition: A | Discharge status: Home / Self Care

## 2024-04-21 ENCOUNTER — Other Ambulatory Visit: Payer: Self-pay

## 2024-04-21 DIAGNOSIS — I82551 Chronic embolism and thrombosis of right peroneal vein: Secondary | ICD-10-CM | POA: Diagnosis not present

## 2024-04-21 DIAGNOSIS — R0602 Shortness of breath: Secondary | ICD-10-CM | POA: Diagnosis not present

## 2024-04-21 DIAGNOSIS — R079 Chest pain, unspecified: Secondary | ICD-10-CM | POA: Diagnosis not present

## 2024-04-21 DIAGNOSIS — R0789 Other chest pain: Secondary | ICD-10-CM | POA: Diagnosis not present

## 2024-04-21 DIAGNOSIS — M7989 Other specified soft tissue disorders: Secondary | ICD-10-CM | POA: Insufficient documentation

## 2024-04-21 DIAGNOSIS — R072 Precordial pain: Secondary | ICD-10-CM | POA: Diagnosis not present

## 2024-04-21 LAB — CBC
HCT: 51.1 % (ref 39.0–52.0)
Hemoglobin: 17.4 g/dL — ABNORMAL HIGH (ref 13.0–17.0)
MCH: 34.6 pg — ABNORMAL HIGH (ref 26.0–34.0)
MCHC: 34.1 g/dL (ref 30.0–36.0)
MCV: 101.6 fL — ABNORMAL HIGH (ref 80.0–100.0)
Platelets: 241 K/uL (ref 150–400)
RBC: 5.03 MIL/uL (ref 4.22–5.81)
RDW: 15.9 % — ABNORMAL HIGH (ref 11.5–15.5)
WBC: 11.4 K/uL — ABNORMAL HIGH (ref 4.0–10.5)
nRBC: 0 % (ref 0.0–0.2)

## 2024-04-21 LAB — HEPATIC FUNCTION PANEL
ALT: 33 U/L (ref 0–44)
AST: 50 U/L — ABNORMAL HIGH (ref 15–41)
Albumin: 3.7 g/dL (ref 3.5–5.0)
Alkaline Phosphatase: 119 U/L (ref 38–126)
Bilirubin, Direct: 0.5 mg/dL — ABNORMAL HIGH (ref 0.0–0.2)
Indirect Bilirubin: 0.8 mg/dL (ref 0.3–0.9)
Total Bilirubin: 1.3 mg/dL — ABNORMAL HIGH (ref 0.0–1.2)
Total Protein: 6.5 g/dL (ref 6.5–8.1)

## 2024-04-21 LAB — BASIC METABOLIC PANEL WITH GFR
Anion gap: 11 (ref 5–15)
BUN: <5 mg/dL — ABNORMAL LOW (ref 6–20)
CO2: 25 mmol/L (ref 22–32)
Calcium: 9 mg/dL (ref 8.9–10.3)
Chloride: 100 mmol/L (ref 98–111)
Creatinine, Ser: 0.91 mg/dL (ref 0.61–1.24)
GFR, Estimated: >60 mL/min (ref >60)
Glucose, Bld: 120 mg/dL — ABNORMAL HIGH (ref 70–99)
Potassium: 3.6 mmol/L (ref 3.5–5.1)
Sodium: 136 mmol/L (ref 135–145)

## 2024-04-21 LAB — LIPASE, BLOOD: Lipase: 20 U/L (ref 11–51)

## 2024-04-21 LAB — TROPONIN T, HIGH SENSITIVITY: Troponin T High Sensitivity: <15 ng/L (ref 0–19)

## 2024-04-21 MED ORDER — OMEPRAZOLE MAGNESIUM 20 MG PO TBEC
20.0000 mg | DELAYED_RELEASE_TABLET | Freq: Every day | ORAL | 1 refills | Status: AC
Start: 2024-04-21 — End: ?

## 2024-04-21 MED ORDER — IOHEXOL 350 MG/ML SOLN
100.0000 mL | Freq: Once | INTRAVENOUS | Status: AC | PRN
  Administered 2024-04-21: 75 mL via INTRAVENOUS

## 2024-04-21 MED ORDER — ALUMINUM-MAGNESIUM-SIMETHICONE 200-200-20 MG/5ML PO SUSP: 30.0000 mL | Freq: Three times a day (TID) | ORAL | 0 refills | Status: AC

## 2024-04-21 NOTE — ED Triage Notes (Signed)
 Patient states shortness of breath x 1 week and chest pain x 2 days.

## 2024-04-21 NOTE — ED Notes (Signed)
 AVS provided by edp was reviewed with pt. Pt verbalized understanding with no additional questions at this time. Pt verified pharmacy.

## 2024-04-21 NOTE — ED Provider Notes (Signed)
 [facility] Provider Note    Event Date/Time   First MD Initiated Contact with Patient 04/21/24 1853     (approximate)   History   Chest Pain  Patient states shortness of breath x 1 week and chest pain x 2 days.    HPI Dylan Paul is a 38 y.o. 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 intake.  No abdominal pain. - No fever, cough, congestion - Chronically has right leg swelling, did not resolve with prior course of Xarelto  which she completed starting in October of last year - Previous DVT/PE were felt to be unprovoked per patient.  No other risk factors on history.       Physical Exam   Triage Vital Signs: ED Triage Vitals  Encounter Vitals Group     BP 04/21/24 1736 (!) 139/101     Girls Systolic BP Percentile --      Girls Diastolic BP Percentile --      Boys Systolic BP Percentile --      Boys Diastolic BP Percentile --      Pulse Rate 04/21/24 1736 72     Resp 04/21/24 1736 18     Temp 04/21/24 1736 97.9 F (36.6 C)     Temp Source 04/21/24 1736 Oral     SpO2 04/21/24 1736 99 %     Weight 04/21/24 1734 180 lb (81.6 kg)     Height 04/21/24 1734 5' 11 (1.803 m)     Head Circumference --      Peak Flow --      Pain Score 04/21/24 1734 4     Pain Loc --      Pain Education --      Exclude from Growth Chart --     Most recent vital signs: Vitals:   04/21/24 1736 04/21/24 2244  BP: (!) 139/101 (!) 117/99  Pulse: 72 69  Resp: 18 16  Temp: 97.9 F (36.6 C) 98.4 F (36.9 C)  SpO2: 99% 99%     General: Awake, no distress.  CV:  Good peripheral perfusion. RRR, RP 2+ Resp:  Normal effort. CTAB Abd:  No distention. Nontender to deep palpation  throughout Other:  +RLE swelling, mild.  No overlying erythema.  Pedal pulse 2+.   ED Results / Procedures / Treatments   Labs (all labs ordered are listed, but only abnormal results are displayed) Labs Reviewed  BASIC METABOLIC PANEL WITH GFR - Abnormal; Notable for the following components:      Result Value   Glucose, Bld 120 (*)    BUN <5 (*)    All other components within normal limits  CBC - Abnormal; Notable for the following components:   WBC 11.4 (*)    Hemoglobin 17.4 (*)    MCV 101.6 (*)    MCH 34.6 (*)    RDW 15.9 (*)    All other components within normal limits  HEPATIC FUNCTION PANEL - Abnormal; Notable for the following components:   AST 50 (*)    Total Bilirubin 1.3 (*)    Bilirubin, Direct 0.5 (*)    All other components within normal limits  LIPASE, BLOOD  TROPONIN T, HIGH SENSITIVITY  EKG  See ED course below.   RADIOLOGY Radiology interpreted by myself and radiology reports reviewed.  Chest x-ray with no acute pathology.  CT PE negative  DVT ultrasound right lower extremity with no acute DVT.  Some residual findings of remote DVT.    PROCEDURES:  Critical Care performed: No  Procedures   MEDICATIONS ORDERED IN ED: Medications  iohexol  (OMNIPAQUE ) 350 MG/ML injection 100 mL (75 mLs Intravenous Contrast Given 04/21/24 2010)     IMPRESSION / MDM / ASSESSMENT AND PLAN / ED COURSE  I reviewed the triage vital signs and the nursing notes.                              DDX/MDM/AP: Differential diagnosis includes, but is not limited to, recurrent PE/dvt, doubt pneumonia, doubt ACS, pneumothorax.  Doubt upper abdominal pathology given very benign abdominal exam here.  Plan: - Labs - Chest x-ray - EKG - CTA chest - DVT ultrasound right lower extremity  Patient's presentation is most consistent with acute presentation with potential threat to life or bodily function.  The patient is on the cardiac monitor to evaluate for evidence  of arrhythmia and/or significant heart rate changes.  ED course below.  Overall reassuring.  CT PE with no pulmonary embolism.  DVT ultrasound with no acute DVT, some residual findings of chronic/remote DVT--per my review of literature this is not an indication for anticoagulation and is anticipated after prior DVT.  Patient does note that he has a long history of GERD, I wonder if his substernal chest discomfort over the past couple weeks may be due to dyspepsia--Rx omeprazole , Maalox.  Cardiac testing reassuring here, EKG nonischemic.  I did offer cardiology referral though patient prefers to follow-up with his PMD which I believe is very reasonable.  ED return precautions in place.  Patient agrees with plan.  Remains very well-appearing with stable vital signs here.  Clinical Course as of 04/21/24 2323  Tue Apr 21, 2024  1923 CBC with mild leukocytosis, mild elevation of hemoglobin though similar to baseline  BMP reviewed, unremarkable [MM]  1923 Trop wnl [MM]  1923 CXR: IMPRESSION: 1. No acute cardiopulmonary process.   [MM]  1929 Ecg = sinus rhythm, rate 78, no gross ST elevation or depression, no significant repolarization abnormality, normal axis, normal intervals.  No evidence of ischemia nor arrhythmia monitor duration. [MM]  2103 CTA chest: IMPRESSION: 1. 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 2. Scattered solid pulmonary nodules up to 3 mm in the right upper lobe; no routine follow-up imaging is recommended per Fleischner Society Guidelines given nodule size and absent high-risk features.   [MM]  2252 DVT US : IMPRESSION: 1. No evidence of acute DVT. 2. 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   [MM]    Clinical Course User Index [MM] Clarine Ozell LABOR, MD     FINAL CLINICAL IMPRESSION(S) / ED DIAGNOSES   Final diagnoses:  Nonspecific chest  pain     Rx / DC Orders   ED Discharge Orders          Ordered    omeprazole  (PRILOSEC  OTC) 20 MG tablet  Daily        04/21/24 2320    aluminum -magnesium  hydroxide-simethicone  (MAALOX) 200-200-20 MG/5ML SUSP  3 times daily before meals & bedtime        04/21/24 2320  Note:  This document was prepared using Dragon voice recognition software and may include unintentional dictation errors.   Clarine Ozell LABOR, MD 04/21/24 [phone]

## 2024-04-21 NOTE — Discharge Instructions (Addendum)
 Evaluation in the emergency department was overall reassuring.  We saw no evidence of any new blood clots in your lungs.  We did see one area of small residual clot in your right leg though no new blood clots, and I do not believe this requires anticoagulation at this time.  Please do follow-up with your primary care provider for reevaluation, and return to the emergency department with any new or worsening symptoms.  It is possible you may have irritation of the lining of your stomach, and I have prescribed you antacids to help in case this is occurring.
